# Patient Record
Sex: Female | Born: 1970 | Hispanic: No | Marital: Married | Smoking: Never smoker
Health system: Southern US, Community
[De-identification: ages and names within clinical notes are randomized; demographics above are authoritative.]

## PROBLEM LIST (undated history)

## (undated) DIAGNOSIS — E119 Type 2 diabetes mellitus without complications: Secondary | ICD-10-CM

## (undated) HISTORY — DX: Type 2 diabetes mellitus without complications: E11.9

---

## 2009-08-22 ENCOUNTER — Emergency Department (HOSPITAL_COMMUNITY): Admission: EM | Admit: 2009-08-22 | Discharge: 2009-08-23 | Payer: Self-pay | Admitting: Emergency Medicine

## 2009-09-06 ENCOUNTER — Other Ambulatory Visit: Admission: RE | Admit: 2009-09-06 | Discharge: 2009-09-06 | Payer: Self-pay | Admitting: Family Medicine

## 2009-10-04 ENCOUNTER — Encounter: Admission: RE | Admit: 2009-10-04 | Discharge: 2009-10-04 | Payer: Self-pay | Admitting: Family Medicine

## 2010-06-05 LAB — URINALYSIS, ROUTINE W REFLEX MICROSCOPIC
Bilirubin Urine: NEGATIVE
Hgb urine dipstick: NEGATIVE
Ketones, ur: NEGATIVE mg/dL
Protein, ur: NEGATIVE mg/dL

## 2010-06-05 LAB — POCT I-STAT, CHEM 8
BUN: 4 mg/dL — ABNORMAL LOW (ref 6–23)
Calcium, Ion: 1.17 mmol/L (ref 1.12–1.32)
Creatinine, Ser: 0.4 mg/dL (ref 0.4–1.2)
Sodium: 136 mEq/L (ref 135–145)

## 2010-06-05 LAB — URINE MICROSCOPIC-ADD ON

## 2011-05-02 ENCOUNTER — Other Ambulatory Visit: Payer: Self-pay | Admitting: Family Medicine

## 2011-05-02 DIAGNOSIS — Z1231 Encounter for screening mammogram for malignant neoplasm of breast: Secondary | ICD-10-CM

## 2011-05-07 ENCOUNTER — Ambulatory Visit
Admission: RE | Admit: 2011-05-07 | Discharge: 2011-05-07 | Disposition: A | Discharge status: Home / Self Care | Payer: BC Managed Care – PPO | Source: Ambulatory Visit | Attending: Family Medicine | Admitting: Family Medicine

## 2011-05-07 DIAGNOSIS — Z1231 Encounter for screening mammogram for malignant neoplasm of breast: Secondary | ICD-10-CM

## 2012-05-05 ENCOUNTER — Other Ambulatory Visit: Payer: Self-pay | Admitting: Family Medicine

## 2012-05-05 DIAGNOSIS — Z1231 Encounter for screening mammogram for malignant neoplasm of breast: Secondary | ICD-10-CM

## 2012-08-01 ENCOUNTER — Ambulatory Visit
Admission: RE | Admit: 2012-08-01 | Discharge: 2012-08-01 | Disposition: A | Discharge status: Home / Self Care | Payer: BC Managed Care – PPO | Source: Ambulatory Visit | Attending: Family Medicine | Admitting: Family Medicine

## 2012-08-01 DIAGNOSIS — Z1231 Encounter for screening mammogram for malignant neoplasm of breast: Secondary | ICD-10-CM

## 2012-08-07 ENCOUNTER — Other Ambulatory Visit (HOSPITAL_COMMUNITY)
Admission: RE | Admit: 2012-08-07 | Discharge: 2012-08-07 | Disposition: A | Discharge status: Home / Self Care | Payer: BC Managed Care – PPO | Source: Ambulatory Visit | Attending: Family Medicine | Admitting: Family Medicine

## 2012-08-07 ENCOUNTER — Other Ambulatory Visit: Payer: Self-pay | Admitting: Family Medicine

## 2012-08-07 DIAGNOSIS — Z124 Encounter for screening for malignant neoplasm of cervix: Secondary | ICD-10-CM | POA: Insufficient documentation

## 2012-12-23 ENCOUNTER — Ambulatory Visit (INDEPENDENT_AMBULATORY_CARE_PROVIDER_SITE_OTHER): Payer: BC Managed Care – PPO | Admitting: *Deleted

## 2012-12-23 DIAGNOSIS — M722 Plantar fascial fibromatosis: Secondary | ICD-10-CM

## 2012-12-23 NOTE — Patient Instructions (Signed)

## 2012-12-23 NOTE — Progress Notes (Signed)
Dispensed orthotics with oral and written instructions for wearing. Patient will follow up with Dr. Al Corpus in 1 month for an orthotic check.

## 2013-06-25 ENCOUNTER — Other Ambulatory Visit: Payer: Self-pay | Admitting: Gastroenterology

## 2013-06-25 DIAGNOSIS — R109 Unspecified abdominal pain: Secondary | ICD-10-CM

## 2013-07-01 ENCOUNTER — Other Ambulatory Visit: Payer: BC Managed Care – PPO

## 2013-07-31 ENCOUNTER — Other Ambulatory Visit: Payer: Self-pay | Admitting: Family Medicine

## 2013-07-31 DIAGNOSIS — N6452 Nipple discharge: Secondary | ICD-10-CM

## 2013-07-31 DIAGNOSIS — R234 Changes in skin texture: Secondary | ICD-10-CM

## 2013-08-11 ENCOUNTER — Ambulatory Visit
Admission: RE | Admit: 2013-08-11 | Discharge: 2013-08-11 | Disposition: A | Discharge status: Home / Self Care | Payer: BC Managed Care – PPO | Source: Ambulatory Visit | Attending: Family Medicine | Admitting: Family Medicine

## 2013-08-11 DIAGNOSIS — R234 Changes in skin texture: Secondary | ICD-10-CM

## 2013-08-11 DIAGNOSIS — N6452 Nipple discharge: Secondary | ICD-10-CM

## 2014-05-05 ENCOUNTER — Other Ambulatory Visit: Payer: Self-pay | Admitting: Dermatology

## 2014-09-16 ENCOUNTER — Encounter: Payer: Self-pay | Admitting: Podiatry

## 2014-09-16 ENCOUNTER — Ambulatory Visit (INDEPENDENT_AMBULATORY_CARE_PROVIDER_SITE_OTHER): Payer: BLUE CROSS/BLUE SHIELD | Admitting: Podiatry

## 2014-09-16 VITALS — BP 122/86 | HR 69 | Resp 12

## 2014-09-16 DIAGNOSIS — M722 Plantar fascial fibromatosis: Secondary | ICD-10-CM | POA: Diagnosis not present

## 2014-09-16 MED ORDER — MELOXICAM 15 MG PO TABS: 15.0000 mg | ORAL_TABLET | Freq: Every day | ORAL | Status: DC

## 2014-09-19 NOTE — Progress Notes (Signed)
She presents today for a chief complaint of pain to the bottom of her left heel. She states that she is about to take a trip to [place] and will be there for an extended period of time and she does not want her foot to her. She states the right one is beginning to hurt but not nearly as bad. She denies any changes in her past medical history medications allergies surgery social history. She does relate that she probably needs to purchase new shoe gear. And would like me to evaluate her orthotics.  Objective: Vital signs are stable she is alert and oriented 3 pulses are palpable bilateral. Neurologic sensorium is intact. She has pain on palpation medial calcaneal tubercle of the left heel. Minimally so to the right heel. Otherwise orthopedics demonstrates rectus foot type bilateral.  Assessment: Plantar fasciitis left greater than right.  Plan: Discussed etiology pathology conservative versus surgical therapies injected the left heel today with Kenalog local anesthesia. Dispensed a prescription for Medrol Dosepak to be followed by meloxicam. Also dispensed to plantar fascial braces and will follow up with her once she returns from [place].

## 2015-05-30 ENCOUNTER — Other Ambulatory Visit: Payer: Self-pay

## 2015-05-30 DIAGNOSIS — Z1231 Encounter for screening mammogram for malignant neoplasm of breast: Secondary | ICD-10-CM

## 2015-06-16 ENCOUNTER — Ambulatory Visit: Payer: Self-pay

## 2015-06-23 DIAGNOSIS — M1711 Unilateral primary osteoarthritis, right knee: Secondary | ICD-10-CM | POA: Diagnosis not present

## 2015-06-29 ENCOUNTER — Ambulatory Visit: Payer: BLUE CROSS/BLUE SHIELD | Attending: Orthopedic Surgery

## 2015-06-29 DIAGNOSIS — M6281 Muscle weakness (generalized): Secondary | ICD-10-CM | POA: Insufficient documentation

## 2015-06-29 DIAGNOSIS — M25561 Pain in right knee: Secondary | ICD-10-CM | POA: Insufficient documentation

## 2015-06-29 NOTE — Therapy (Signed)
[facility] [address], [address] [place], [place], [address] Phone: [phone]   Fax:  [phone]  Physical Therapy Evaluation  Patient Details  Name: Alison Sparks MRN: [id number] Date of Birth: [birth year] Referring Provider: Royal Piedra, MD  Encounter Date: 06/29/2015      PT End of Session - 06/29/15 1644    Visit Number 1   Date for PT Re-Evaluation 08/24/15   PT Start Time 1612   PT Stop Time 1645   PT Time Calculation (min) 33 min   Activity Tolerance Patient tolerated treatment well   Behavior During Therapy [facility] for tasks assessed/performed      Past Medical History  Diagnosis Date  . Diabetes mellitus without complication (HCC)     History reviewed. No pertinent past surgical history.  There were no vitals filed for this visit.       Subjective Assessment - 06/29/15 1617    Subjective Pt presents to PT with complaints of Rt knee pain that began ~6 weeks ago.  No known injury. Pt got a new dog and has been walking more with the dog.  Pt had cortizone injection in early March and this helped for a few days.     Pertinent History Cortizone injection 05/2015.     Limitations Walking   How long can you walk comfortably? able to walk with dog 3+ miles-Rt knee pain with decending steps and hills   Diagnostic tests x-ray: DJD in the Rt knee.    Patient Stated Goals reduce Rt knee pain   Currently in Pain? Yes   Pain Score 6    Pain Location Knee   Pain Orientation Right   Pain Descriptors / Indicators Sore   Pain Type Acute pain   Pain Onset More than a month ago   Pain Frequency Intermittent   Aggravating Factors  descending steps and hills   Pain Relieving Factors not descending hills or steps, Alison Sparks            [facility] PT Assessment - 06/29/15 0001    Assessment   Medical Diagnosis OA Rt knee   Referring Provider Royal Piedra, MD   Onset Date/Surgical Date 05/17/15   Next MD Visit 07/17/15    Precautions   Precautions None   Balance Screen   Has the patient fallen in the past 6 months No   Has the patient had a decrease in activity level because of a fear of falling?  No   Is the patient reluctant to leave their home because of a fear of falling?  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    Leisure walking dog   Cognition   Overall Cognitive Status Within Functional Limits for tasks assessed   Observation/Other Assessments   Focus on Therapeutic Outcomes (FOTO)  49% limitation   Posture/Postural Control   Posture/Postural Control No significant limitations   ROM / Strength   AROM / PROM / Strength AROM;PROM;Strength   AROM   Overall AROM  Within functional limits for tasks performed   Overall AROM Comments Rt=Lt knee and hip AROM   PROM   Overall PROM  Within functional limits for tasks performed   Strength   Overall Strength Deficits   Overall Strength Comments Bil hip strength 4/5, knees 4+/5 bil.     Palpation  Patella mobility crepitus in bil.patella with mobs in all directions.  Pain with compression on the Rt   Ambulation/Gait   Ambulation/Gait Yes   Ambulation/Gait Assistance 7: Independent   Ambulation Distance (Feet) 100 Feet   Gait Pattern Within Functional Limits   Gait Comments normal stair ambulation without pain                           PT Education - 06/29/15 1640    Education provided Yes   Education Details HEP: hip and knee strength, hamstring stretches   Person(s) Educated Patient   Methods Explanation;Demonstration;Handout   Comprehension Verbalized understanding;Returned demonstration          PT Short Term Goals - 06/29/15 1649    PT SHORT TERM GOAL #1   Title be independent in initial HEP   Time 4   Period Weeks   Status New   PT SHORT TERM GOAL #2   Title report < or = to 4/10 Rt knee pain  with activity   Time 4   Period Weeks   Status New           PT Long Term Goals - 06/29/15 1613    PT LONG TERM GOAL #1   Title be independent in advanced HEP   Time 8   Period Weeks   Status New   PT LONG TERM GOAL #2   Title reduce FOTO to < or = to 36% limitation   Time 8   Period Weeks   Status New   PT LONG TERM GOAL #3   Title report < or = to 2/10 Rt knee pain with activity   Time 8   Period Weeks   Status New   PT LONG TERM GOAL #4   Title descend steps or hills with 70% less Rt knee pain   Time 8   Period Weeks   Status New   PT LONG TERM GOAL #5   Title demonstrate 4+/5 bilateral hip strength to improve ability to descend steps and hills   Time 8   Period Weeks   Status New               Plan - 06/29/15 1644    Clinical Impression Statement Pt presents to PT with complaints of Rt knee pain that began ~6 weeks ago.  Pt reports that she has been walking her new dog and this might be a contributing factor.  Recent x-ray showed DJD in the Rt knee.  Pt demonstrates weakness in bil. hips and kness and crepitus in bil. knees with open and closed chain exercises.  Pt with palpable tenderness over distal patella.  Pt will benefit from skilled PT for hip and knee strength, modalties and manual as needed.     Rehab Potential Good   PT Frequency 2x / week   PT Duration 8 weeks   PT Treatment/Interventions ADLs/Self Care Home Management;Cryotherapy;Electrical Stimulation;Moist Heat;Therapeutic exercise;Therapeutic activities;Functional mobility training;Stair training;Gait training;Ultrasound;Neuromuscular re-education;Patient/family education;Manual techniques;Taping;Dry needling;Passive range of motion   PT Next Visit Plan Bil hip and knee strength, manual to distal Rt patella, modalities as needed.   Consulted and Agree with Plan of Care Patient      Patient will benefit from skilled therapeutic intervention in order to improve the following deficits and  impairments:  Pain, Decreased strength, Decreased activity tolerance  Visit Diagnosis: Pain in right knee - Plan: PT plan of care cert/re-cert  Muscle weakness (generalized) -  Plan: PT plan of care cert/re-cert     Problem List There are no active problems to display for this patient.   Lorrene Reid, PT 06/29/2015 4:52 PM  [facility] [address], [address] [place], [place], [address] Phone: [phone]   Fax:  [phone]  Name: Alison Sparks MRN: [id number] Date of Birth: [birth year]

## 2015-06-29 NOTE — Patient Instructions (Signed)
Hip Flexion / Knee Extension: Straight-Leg Raise (Eccentric)   Lie on back. Lift leg with knee straight. Slowly lower leg for 3-5 seconds. Do 2 sets of 10, 2x/day  ABDUCTION: Side-Lying (Active)   Lie on left side, top leg straight. Raise top leg as far as possible. Use ___ lbs. Complete _2__ sets of _10__ repetitions. Perform _2__ sessions per day.  http://gtsc.exer.us/94   KNEE: Extension, Long Arc Quads - Sitting    Raise leg until knee is straight.  Hold 5 seconds.  __10_ reps per set, _3__ sets per day  Copyright  VHI. All rights reserved.  HIP: Hamstrings - Short Sitting     Keep knee straight. Lift chest. Hold 20___ seconds. __3_ reps per set, __3_ sets per day  Copyright  VHI. All rights reserved.  [facility] [address], [address] [place], [place] [address] Phone # [phone] Fax [phone]

## 2015-06-30 ENCOUNTER — Ambulatory Visit: Payer: Self-pay | Admitting: Physical Therapy

## 2015-07-05 ENCOUNTER — Ambulatory Visit
Admission: RE | Admit: 2015-07-05 | Discharge: 2015-07-05 | Disposition: A | Discharge status: Home / Self Care | Payer: BLUE CROSS/BLUE SHIELD | Source: Ambulatory Visit

## 2015-07-05 DIAGNOSIS — Z1231 Encounter for screening mammogram for malignant neoplasm of breast: Secondary | ICD-10-CM

## 2015-07-06 ENCOUNTER — Ambulatory Visit: Payer: BLUE CROSS/BLUE SHIELD | Admitting: Physical Therapy

## 2015-07-06 ENCOUNTER — Encounter: Payer: Self-pay | Admitting: Physical Therapy

## 2015-07-06 DIAGNOSIS — M6281 Muscle weakness (generalized): Secondary | ICD-10-CM

## 2015-07-06 DIAGNOSIS — M25561 Pain in right knee: Secondary | ICD-10-CM

## 2015-07-06 NOTE — Patient Instructions (Signed)
Apply ice to her knee 2x day for 10 min. Pt verbally agreed to do.

## 2015-07-06 NOTE — Therapy (Signed)
[facility] [address] [place], [place], [address] Phone: [phone]   Fax:  [phone]  Physical Therapy Treatment  Patient Details  Name: Lashon Beringer MRN: [id number] Date of Birth: 09-26-1970 Referring Provider: Royal Piedra, MD  Encounter Date: 07/06/2015      PT End of Session - 07/06/15 1011    Visit Number 2   Date for PT Re-Evaluation 08/24/15   PT Start Time 0930   PT Stop Time 1013   PT Time Calculation (min) 43 min   Activity Tolerance Patient tolerated treatment well   Behavior During Therapy [facility] for tasks assessed/performed      Past Medical History  Diagnosis Date  . Diabetes mellitus without complication (HCC)     History reviewed. No pertinent past surgical history.  There were no vitals filed for this visit.      Subjective Assessment - 07/06/15 0938    Subjective I couldn't really exercises and my knee continues to hurt.    Pain Score 3    Pain Location Knee   Pain Orientation Right   Pain Descriptors / Indicators Dull   Aggravating Factors  The morning is rough   Pain Relieving Factors meds   Multiple Pain Sites No                         [facility] Adult PT Treatment/Exercise - 07/06/15 0001    Knee/Hip Exercises: Aerobic   Nustep L2 x 8 min   Knee/Hip Exercises: Standing   Hip Abduction Stengthening;Both;1 set;10 reps   Other Standing Knee Exercises Tree pose RT 2 x10 sec   2 fingers needed for balance   Knee/Hip Exercises: Supine   Quad Sets Strengthening;Right;20 reps   Straight Leg Raises Strengthening;Right;1 set;10 reps   Other Supine Knee/Hip Exercises Glute & ball squeeze 10x 5 sec    Ultrasound   Ultrasound Location Rt knee anteriorlateral   Ultrasound Parameters 1wtcm2 100%   Ultrasound Goals Pain                  PT Short Term Goals - 07/06/15 0949    PT SHORT TERM GOAL #1   Title be independent in initial HEP   Time 4    Period Weeks   Status On-going           PT Long Term Goals - 06/29/15 1613    PT LONG TERM GOAL #1   Title be independent in advanced HEP   Time 8   Period Weeks   Status New   PT LONG TERM GOAL #2   Title reduce FOTO to < or = to 36% limitation   Time 8   Period Weeks   Status New   PT LONG TERM GOAL #3   Title report < or = to 2/10 Rt knee pain with activity   Time 8   Period Weeks   Status New   PT LONG TERM GOAL #4   Title descend steps or hills with 70% less Rt knee pain   Time 8   Period Weeks   Status New   PT LONG TERM GOAL #5   Title demonstrate 4+/5 bilateral hip strength to improve ability to descend steps and hills   Time 8   Period Weeks   Status New               Plan - 07/06/15 1005    Clinical Impression Statement  pt reports her kids were on Spring Break last so she was unable to do her HEP. She thinks she can get into a routine now.  Used Ultrasound today in her tender area to see if we can make that less tender. Just her first visit after eval so some early for goal  attainment.    Rehab Potential Good   PT Frequency 2x / week   PT Duration 8 weeks   PT Treatment/Interventions ADLs/Self Care Home Management;Cryotherapy;Electrical Stimulation;Moist Heat;Therapeutic exercise;Therapeutic activities;Functional mobility training;Stair training;Gait training;Ultrasound;Neuromuscular re-education;Patient/family education;Manual techniques;Taping;Dry needling;Passive range of motion   PT Next Visit Plan Continue to strengthen knee and hips. See if US was helpful.    Consulted and Agree with Plan of Care Patient      Patient will benefit from skilled therapeutic intervention in order to improve the following deficits and impairments:  Pain, Decreased strength, Decreased activity tolerance  Visit Diagnosis: Pain in right knee  Muscle weakness (generalized)     Problem List There are no active problems to display for this  patient.   Ariatna Jester, PTA 07/06/2015, 10:14 AM  [facility] [address], [address] [place], [place], [address] Phone: [phone]   Fax:  [phone]  Name: Oren Sectionnjali Aldaz MRN: [id number] Date of Birth: [birth year]

## 2015-07-08 ENCOUNTER — Ambulatory Visit: Payer: BLUE CROSS/BLUE SHIELD | Admitting: Physical Therapy

## 2015-07-08 DIAGNOSIS — M6281 Muscle weakness (generalized): Secondary | ICD-10-CM | POA: Diagnosis not present

## 2015-07-08 DIAGNOSIS — M25561 Pain in right knee: Secondary | ICD-10-CM

## 2015-07-08 NOTE — Therapy (Signed)
[facility] [address], [address] [place], [place], [address] Phone: [phone]   Fax:  [phone]  Physical Therapy Treatment  Patient Details  Name: Alison Sparks MRN: [id number] Date of Birth: Apr 27, 1970 Referring Provider: Royal Piedra, MD  Encounter Date: 07/08/2015      PT End of Session - 07/08/15 0931    Visit Number 3   Date for PT Re-Evaluation 08/24/15   PT Start Time 0923   PT Stop Time 1008   PT Time Calculation (min) 45 min   Activity Tolerance Patient tolerated treatment well   Behavior During Therapy [facility] for tasks assessed/performed      Past Medical History  Diagnosis Date  . Diabetes mellitus without complication (HCC)     No past surgical history on file.  There were no vitals filed for this visit.      Subjective Assessment - 07/08/15 0931    Subjective Since yesterday, no pain in rt knee.    Pertinent History Cortizone injection 05/2015.     Limitations Walking   How long can you walk comfortably? able to walk with dog 3+ miles-Rt knee pain with decending steps and hills   Diagnostic tests x-ray: DJD in the Rt knee.    Patient Stated Goals reduce Rt knee pain   Currently in Pain? No/denies                         [facility] Adult PT Treatment/Exercise - 07/08/15 0001    Knee/Hip Exercises: Aerobic   Nustep L2 x 8 min   Knee/Hip Exercises: Standing   Lateral Step Up Both;1 set;10 reps;Hand Hold: 1;Step Height: 6"  1.5# added, pt feels the gluts    Other Standing Knee Exercises Tree pose RT 2 x20 sec   2 fingers needed for balance at times   Knee/Hip Exercises: Supine   Quad Sets Strengthening;Right;20 reps   Straight Leg Raises Strengthening;Right;1 set;10 reps   Other Supine Knee/Hip Exercises Glute & ball squeeze 10x 5 sec    Ultrasound   Ultrasound Location Rt knee   Ultrasound Parameters 100%, , 1W/cm2    Ultrasound Goals Pain                  PT  Short Term Goals - 07/08/15 0933    PT SHORT TERM GOAL #1   Title be independent in initial HEP   Time 4   Period Weeks   Status On-going   PT SHORT TERM GOAL #2   Title report < or = to 4/10 Rt knee pain with activity   Time 4   Period Weeks   Status On-going           PT Long Term Goals - 06/29/15 1613    PT LONG TERM GOAL #1   Title be independent in advanced HEP   Time 8   Period Weeks   Status New   PT LONG TERM GOAL #2   Title reduce FOTO to < or = to 36% limitation   Time 8   Period Weeks   Status New   PT LONG TERM GOAL #3   Title report < or = to 2/10 Rt knee pain with activity   Time 8   Period Weeks   Status New   PT LONG TERM GOAL #4   Title descend steps or hills with 70% less Rt knee pain   Time 8   Period Weeks  Status New   PT LONG TERM GOAL #5   Title demonstrate 4+/5 bilateral hip strength to improve ability to descend steps and hills   Time 8   Period Weeks   Status New               Plan - 07/08/15 0932    Clinical Impression Statement Pt with good tolerance of exercises, due to no pain today. .    Rehab Potential Good   PT Frequency 2x / week   PT Duration 8 weeks   PT Treatment/Interventions ADLs/Self Care Home Management;Cryotherapy;Electrical Stimulation;Moist Heat;Therapeutic exercise;Therapeutic activities;Functional mobility training;Stair training;Gait training;Ultrasound;Neuromuscular re-education;Patient/family education;Manual techniques;Taping;Dry needling;Passive range of motion   PT Next Visit Plan Continue to strengthen knee and hips. See if US was helpful.    Consulted and Agree with Plan of Care Patient      Patient will benefit from skilled therapeutic intervention in order to improve the following deficits and impairments:  Pain, Decreased strength, Decreased activity tolerance  Visit Diagnosis: Pain in right knee  Muscle weakness (generalized)   Problem List There are no active problems to display for  this patient.  Laney Louderback Naumann-Houegnifio, PTA 07/08/2015 12:00 PM  [facility] [address], [address] [place], [place], [address] Phone: [phone]   Fax:  [phone]  Name: Alison Sparks MRN: [id number] Date of Birth: 1971-01-06

## 2015-07-13 ENCOUNTER — Encounter: Payer: BLUE CROSS/BLUE SHIELD | Admitting: Physical Therapy

## 2015-07-15 ENCOUNTER — Ambulatory Visit: Payer: BLUE CROSS/BLUE SHIELD | Admitting: Physical Therapy

## 2015-07-15 DIAGNOSIS — M25561 Pain in right knee: Secondary | ICD-10-CM

## 2015-07-15 DIAGNOSIS — M6281 Muscle weakness (generalized): Secondary | ICD-10-CM | POA: Diagnosis not present

## 2015-07-15 NOTE — Therapy (Addendum)
[facility] [address], [address] [place], [place], [address] Phone: [phone]   Fax:  [phone]  Physical Therapy Treatment  Patient Details  Name: Alison Sparks MRN: [id number] Date of Birth: 1970/10/11 Referring Provider: Royal Piedra, MD  Encounter Date: 07/15/2015      PT End of Session - 07/15/15 1012    Visit Number 4   Date for PT Re-Evaluation 08/24/15   PT Start Time 0928   PT Stop Time 1013   PT Time Calculation (min) 45 min   Activity Tolerance Patient tolerated treatment well   Behavior During Therapy [facility] for tasks assessed/performed      Past Medical History  Diagnosis Date  . Diabetes mellitus without complication (HCC)     No past surgical history on file.  There were no vitals filed for this visit.      Subjective Assessment - 07/15/15 0930    Subjective Knee is doing great. She reports her plantar fascitits is bothering her more. This has been treated in th epast by podiatrist.   Currently in Pain? Yes   Pain Score 7    Pain Location Foot   Pain Orientation Left   Pain Descriptors / Indicators Shooting;Stabbing   Aggravating Factors  Foot has been pretty constant lately.   Pain Relieving Factors Not much, knee is 80% better via exercises and [place]   Multiple Pain Sites No            [facility] PT Assessment - 07/15/15 0001    Observation/Other Assessments   Focus on Therapeutic Outcomes (FOTO)  28% limitation   Strength   Overall Strength Comments Bil hip 4+/5 - 5/5                      [facility] Adult PT Treatment/Exercise - 07/15/15 0001    Knee/Hip Exercises: Aerobic   Nustep L2 x 10 min   Knee/Hip Exercises: Standing   Hip Abduction Stengthening;Both;2 sets;10 reps   Knee/Hip Exercises: Supine   Straight Leg Raises Strengthening;Right;3 sets;10 reps   Other Supine Knee/Hip Exercises Glute & ball squeeze 10x 5 sec    Ultrasound   Ultrasound Location Rt lateral knee   Ultrasound Parameters 100% 3 MZ 1.3 wtcm2   Ultrasound Goals Pain                  PT Short Term Goals - 07/15/15 0934    PT SHORT TERM GOAL #1   Title be independent in initial HEP   Time 4   Period Weeks   Status Achieved   PT SHORT TERM GOAL #2   Title report < or = to 4/10 Rt knee pain with activity   Time 4   Period Weeks   Status Achieved  4/10           PT Long Term Goals - 07/15/15 0935    PT LONG TERM GOAL #1   Title be independent in advanced HEP   Time 8   Period Weeks   Status Achieved   PT LONG TERM GOAL #2   Title reduce FOTO to < or = to 36% limitation   Time 8   Period Weeks   Status Achieved  28% limited   PT LONG TERM GOAL #3   Title report < or = to 2/10 Rt knee pain with activity   Time 8   Period Weeks   PT LONG TERM GOAL #4   Title descend steps  or hills with 70% less Rt knee pain   Time 8   Period Weeks   Status Achieved  80%   PT LONG TERM GOAL #5   Title demonstrate 4+/5 bilateral hip strength to improve ability to descend steps and hills   Time 8   Period Weeks   Status Achieved               Plan - 07/15/15 1012    Clinical Impression Statement Pt reports her knee is 80% better. She has a history of plantar fascititis which is more painful and limiting at this time. She would like to DC her knee and persue order for her foot.     Rehab Potential Good   PT Frequency 2x / week   PT Duration 8 weeks   PT Treatment/Interventions ADLs/Self Care Home Management;Cryotherapy;Electrical Stimulation;Moist Heat;Therapeutic exercise;Therapeutic activities;Functional mobility training;Stair training;Gait training;Ultrasound;Neuromuscular re-education;Patient/family education;Manual techniques;Taping;Dry needling;Passive range of motion   PT Next Visit Plan DC knee. Pt wants to persue new order for her foot.    Consulted and Agree with Plan of Care Patient      Patient will benefit from skilled therapeutic intervention in  order to improve the following deficits and impairments:  Pain, Decreased strength, Decreased activity tolerance  Visit Diagnosis: Pain in right knee  Muscle weakness (generalized)     Problem List There are no active problems to display for this patient.   Myrene Galas, PTA 07/15/2015 10:17 AM PHYSICAL THERAPY DISCHARGE SUMMARY  Visits from Start of Care: 4  Current functional level related to goals / functional outcomes: See above for current status.  Pt will D/C to HEP and pursue PT order for plantar faciitis.     Remaining deficits: See above for current status.     Education / Equipment: HEP Plan: Patient agrees to discharge.  Patient goals were met. Patient is being discharged due to meeting the stated rehab goals.  ?????   Sigurd Sos, PT 07/18/2015 8:06 AM  [facility] [address], [place], [place], [address] Phone: [phone]   Fax:  [phone]  Name: Alison Sparks MRN: [id number] Date of Birth: October 08, 1970

## 2015-12-05 DIAGNOSIS — E7800 Pure hypercholesterolemia, unspecified: Secondary | ICD-10-CM | POA: Diagnosis not present

## 2015-12-05 DIAGNOSIS — E1165 Type 2 diabetes mellitus with hyperglycemia: Secondary | ICD-10-CM | POA: Diagnosis not present

## 2015-12-07 DIAGNOSIS — E7800 Pure hypercholesterolemia, unspecified: Secondary | ICD-10-CM | POA: Diagnosis not present

## 2015-12-07 DIAGNOSIS — E1165 Type 2 diabetes mellitus with hyperglycemia: Secondary | ICD-10-CM | POA: Diagnosis not present

## 2015-12-07 DIAGNOSIS — E559 Vitamin D deficiency, unspecified: Secondary | ICD-10-CM | POA: Diagnosis not present

## 2015-12-07 DIAGNOSIS — Z23 Encounter for immunization: Secondary | ICD-10-CM | POA: Diagnosis not present

## 2015-12-07 DIAGNOSIS — I10 Essential (primary) hypertension: Secondary | ICD-10-CM | POA: Diagnosis not present

## 2016-03-30 DIAGNOSIS — J069 Acute upper respiratory infection, unspecified: Secondary | ICD-10-CM | POA: Diagnosis not present

## 2016-04-25 DIAGNOSIS — J101 Influenza due to other identified influenza virus with other respiratory manifestations: Secondary | ICD-10-CM | POA: Diagnosis not present

## 2016-04-25 DIAGNOSIS — R509 Fever, unspecified: Secondary | ICD-10-CM | POA: Diagnosis not present

## 2016-06-04 DIAGNOSIS — E559 Vitamin D deficiency, unspecified: Secondary | ICD-10-CM | POA: Diagnosis not present

## 2016-06-04 DIAGNOSIS — E7800 Pure hypercholesterolemia, unspecified: Secondary | ICD-10-CM | POA: Diagnosis not present

## 2016-06-04 DIAGNOSIS — E1165 Type 2 diabetes mellitus with hyperglycemia: Secondary | ICD-10-CM | POA: Diagnosis not present

## 2016-06-04 DIAGNOSIS — I10 Essential (primary) hypertension: Secondary | ICD-10-CM | POA: Diagnosis not present

## 2016-08-15 ENCOUNTER — Other Ambulatory Visit: Payer: Self-pay | Admitting: Family Medicine

## 2016-08-15 DIAGNOSIS — Z1231 Encounter for screening mammogram for malignant neoplasm of breast: Secondary | ICD-10-CM

## 2016-09-04 ENCOUNTER — Ambulatory Visit
Admission: RE | Admit: 2016-09-04 | Discharge: 2016-09-04 | Disposition: A | Discharge status: Home / Self Care | Payer: BLUE CROSS/BLUE SHIELD | Source: Ambulatory Visit | Attending: Family Medicine | Admitting: Family Medicine

## 2016-09-04 DIAGNOSIS — Z1231 Encounter for screening mammogram for malignant neoplasm of breast: Secondary | ICD-10-CM

## 2016-10-02 ENCOUNTER — Encounter (INDEPENDENT_AMBULATORY_CARE_PROVIDER_SITE_OTHER): Payer: BLUE CROSS/BLUE SHIELD | Admitting: Podiatry

## 2016-10-02 NOTE — Progress Notes (Signed)
This encounter was created in error - please disregard.

## 2016-10-16 ENCOUNTER — Ambulatory Visit (INDEPENDENT_AMBULATORY_CARE_PROVIDER_SITE_OTHER): Payer: BLUE CROSS/BLUE SHIELD | Admitting: Podiatry

## 2016-10-16 ENCOUNTER — Encounter: Payer: Self-pay | Admitting: Podiatry

## 2016-10-16 DIAGNOSIS — M722 Plantar fascial fibromatosis: Secondary | ICD-10-CM | POA: Diagnosis not present

## 2016-10-17 NOTE — Progress Notes (Signed)
She presents today for follow-up of plantar fasciitis states that is starting to bother her just a little bit but would like to consider a new pair of orthotics. She feels that is her shoes and her orthotics that are resulting in her symptoms.  Objective: Vital signs are stable she is alert and oriented 3. Pulses are palpable. No changes in previous evaluation. She does still have some tenderness on palpation medial continue tube of the left heel but not nearly as painful as previously noted.  Assessment: Pain and limb associated with plantar fasciitis left foot.  Plan: She was molded for orthotics today. Follow-up with me in the near future.

## 2016-11-06 ENCOUNTER — Ambulatory Visit (INDEPENDENT_AMBULATORY_CARE_PROVIDER_SITE_OTHER): Payer: BLUE CROSS/BLUE SHIELD | Admitting: Podiatry

## 2016-11-06 ENCOUNTER — Encounter: Payer: Self-pay | Admitting: Podiatry

## 2016-11-06 DIAGNOSIS — M722 Plantar fascial fibromatosis: Secondary | ICD-10-CM

## 2016-11-06 NOTE — Progress Notes (Signed)
She presents today to pick up her orthotics. She states that her plantar fasciitis is doing better than it was. She states that she would like to hold off on performing matrixectomy's to her toenails until after her next A1c.  Objective: Vital signs are stable she is alert and oriented 3. Pulses are palpable. Orthotics. No pain on palpation medial calcaneal tubercles. No signs of infection.  Assessment: Plantar fasciitis resolving.  Plan: Follow up with me in September for matrixectomy's. Follow-up with me with concerns of her orthotics.

## 2016-11-22 ENCOUNTER — Other Ambulatory Visit: Payer: BLUE CROSS/BLUE SHIELD | Admitting: Orthotics

## 2016-11-27 ENCOUNTER — Other Ambulatory Visit: Payer: BLUE CROSS/BLUE SHIELD | Admitting: Orthotics

## 2016-12-04 DIAGNOSIS — J069 Acute upper respiratory infection, unspecified: Secondary | ICD-10-CM | POA: Diagnosis not present

## 2016-12-18 DIAGNOSIS — Z23 Encounter for immunization: Secondary | ICD-10-CM | POA: Diagnosis not present

## 2017-02-12 ENCOUNTER — Other Ambulatory Visit: Payer: Self-pay | Admitting: Family Medicine

## 2017-02-12 ENCOUNTER — Other Ambulatory Visit (HOSPITAL_COMMUNITY)
Admission: RE | Admit: 2017-02-12 | Discharge: 2017-02-12 | Disposition: A | Discharge status: Home / Self Care | Payer: BLUE CROSS/BLUE SHIELD | Source: Ambulatory Visit | Attending: Family Medicine | Admitting: Family Medicine

## 2017-02-12 DIAGNOSIS — Z0000 Encounter for general adult medical examination without abnormal findings: Secondary | ICD-10-CM | POA: Diagnosis not present

## 2017-02-12 DIAGNOSIS — N926 Irregular menstruation, unspecified: Secondary | ICD-10-CM | POA: Diagnosis not present

## 2017-02-12 DIAGNOSIS — Z124 Encounter for screening for malignant neoplasm of cervix: Secondary | ICD-10-CM | POA: Diagnosis not present

## 2017-02-12 DIAGNOSIS — M62838 Other muscle spasm: Secondary | ICD-10-CM | POA: Diagnosis not present

## 2017-02-12 DIAGNOSIS — I10 Essential (primary) hypertension: Secondary | ICD-10-CM | POA: Diagnosis not present

## 2017-02-12 DIAGNOSIS — E1165 Type 2 diabetes mellitus with hyperglycemia: Secondary | ICD-10-CM | POA: Diagnosis not present

## 2017-02-14 LAB — CYTOLOGY - PAP: DIAGNOSIS: NEGATIVE

## 2017-02-15 DIAGNOSIS — R7989 Other specified abnormal findings of blood chemistry: Secondary | ICD-10-CM | POA: Diagnosis not present

## 2017-02-15 DIAGNOSIS — E781 Pure hyperglyceridemia: Secondary | ICD-10-CM | POA: Diagnosis not present

## 2017-02-21 DIAGNOSIS — M25561 Pain in right knee: Secondary | ICD-10-CM | POA: Diagnosis not present

## 2017-04-10 DIAGNOSIS — E119 Type 2 diabetes mellitus without complications: Secondary | ICD-10-CM | POA: Diagnosis not present

## 2017-04-19 DIAGNOSIS — E559 Vitamin D deficiency, unspecified: Secondary | ICD-10-CM | POA: Diagnosis not present

## 2017-04-19 DIAGNOSIS — E1165 Type 2 diabetes mellitus with hyperglycemia: Secondary | ICD-10-CM | POA: Diagnosis not present

## 2017-04-19 DIAGNOSIS — E7800 Pure hypercholesterolemia, unspecified: Secondary | ICD-10-CM | POA: Diagnosis not present

## 2017-04-19 DIAGNOSIS — I10 Essential (primary) hypertension: Secondary | ICD-10-CM | POA: Diagnosis not present

## 2017-08-27 ENCOUNTER — Other Ambulatory Visit: Payer: Self-pay | Admitting: Family Medicine

## 2017-08-27 DIAGNOSIS — Z1231 Encounter for screening mammogram for malignant neoplasm of breast: Secondary | ICD-10-CM

## 2017-08-30 DIAGNOSIS — R011 Cardiac murmur, unspecified: Secondary | ICD-10-CM | POA: Diagnosis not present

## 2017-08-30 DIAGNOSIS — R079 Chest pain, unspecified: Secondary | ICD-10-CM | POA: Diagnosis not present

## 2017-08-30 DIAGNOSIS — M6248 Contracture of muscle, other site: Secondary | ICD-10-CM | POA: Diagnosis not present

## 2017-09-02 ENCOUNTER — Telehealth: Payer: Self-pay | Admitting: *Deleted

## 2017-09-02 NOTE — Telephone Encounter (Signed)
ECHO referral from [facility] [employer] placed in scheduling box. [phone]  [phone]

## 2017-09-04 ENCOUNTER — Other Ambulatory Visit (HOSPITAL_COMMUNITY): Payer: Self-pay | Admitting: Family Medicine

## 2017-09-04 DIAGNOSIS — R011 Cardiac murmur, unspecified: Secondary | ICD-10-CM

## 2017-09-05 ENCOUNTER — Telehealth: Payer: Self-pay

## 2017-09-05 NOTE — Telephone Encounter (Signed)
Sent referral to scheduling and filed notes 

## 2017-09-12 ENCOUNTER — Ambulatory Visit
Admission: RE | Admit: 2017-09-12 | Discharge: 2017-09-12 | Disposition: A | Discharge status: Home / Self Care | Payer: BLUE CROSS/BLUE SHIELD | Source: Ambulatory Visit | Attending: Family Medicine | Admitting: Family Medicine

## 2017-09-12 DIAGNOSIS — Z1231 Encounter for screening mammogram for malignant neoplasm of breast: Secondary | ICD-10-CM

## 2017-09-13 ENCOUNTER — Other Ambulatory Visit: Payer: Self-pay | Admitting: Family Medicine

## 2017-09-13 DIAGNOSIS — R928 Other abnormal and inconclusive findings on diagnostic imaging of breast: Secondary | ICD-10-CM

## 2017-09-24 ENCOUNTER — Other Ambulatory Visit: Payer: Self-pay | Admitting: Family Medicine

## 2017-09-24 ENCOUNTER — Ambulatory Visit
Admission: RE | Admit: 2017-09-24 | Discharge: 2017-09-24 | Disposition: A | Discharge status: Home / Self Care | Payer: BLUE CROSS/BLUE SHIELD | Source: Ambulatory Visit | Attending: Family Medicine | Admitting: Family Medicine

## 2017-09-24 DIAGNOSIS — R928 Other abnormal and inconclusive findings on diagnostic imaging of breast: Secondary | ICD-10-CM

## 2017-09-24 DIAGNOSIS — R921 Mammographic calcification found on diagnostic imaging of breast: Secondary | ICD-10-CM | POA: Diagnosis not present

## 2017-09-26 ENCOUNTER — Ambulatory Visit (HOSPITAL_COMMUNITY): Payer: BLUE CROSS/BLUE SHIELD | Attending: Cardiology

## 2017-09-26 ENCOUNTER — Other Ambulatory Visit: Payer: Self-pay

## 2017-09-26 DIAGNOSIS — R011 Cardiac murmur, unspecified: Secondary | ICD-10-CM | POA: Insufficient documentation

## 2017-11-26 DIAGNOSIS — Z23 Encounter for immunization: Secondary | ICD-10-CM | POA: Diagnosis not present

## 2017-12-10 DIAGNOSIS — J019 Acute sinusitis, unspecified: Secondary | ICD-10-CM | POA: Diagnosis not present

## 2017-12-10 DIAGNOSIS — R030 Elevated blood-pressure reading, without diagnosis of hypertension: Secondary | ICD-10-CM | POA: Diagnosis not present

## 2018-01-16 DIAGNOSIS — B349 Viral infection, unspecified: Secondary | ICD-10-CM | POA: Diagnosis not present

## 2018-02-05 DIAGNOSIS — M1711 Unilateral primary osteoarthritis, right knee: Secondary | ICD-10-CM | POA: Diagnosis not present

## 2018-03-28 ENCOUNTER — Other Ambulatory Visit: Payer: Self-pay | Admitting: Family Medicine

## 2018-03-28 ENCOUNTER — Ambulatory Visit
Admission: RE | Admit: 2018-03-28 | Discharge: 2018-03-28 | Disposition: A | Discharge status: Home / Self Care | Payer: BLUE CROSS/BLUE SHIELD | Source: Ambulatory Visit | Attending: Family Medicine | Admitting: Family Medicine

## 2018-03-28 DIAGNOSIS — R921 Mammographic calcification found on diagnostic imaging of breast: Secondary | ICD-10-CM

## 2018-05-29 DIAGNOSIS — I10 Essential (primary) hypertension: Secondary | ICD-10-CM | POA: Diagnosis not present

## 2018-05-29 DIAGNOSIS — E781 Pure hyperglyceridemia: Secondary | ICD-10-CM | POA: Diagnosis not present

## 2018-05-29 DIAGNOSIS — E1165 Type 2 diabetes mellitus with hyperglycemia: Secondary | ICD-10-CM | POA: Diagnosis not present

## 2018-05-29 DIAGNOSIS — E119 Type 2 diabetes mellitus without complications: Secondary | ICD-10-CM | POA: Diagnosis not present

## 2018-10-01 ENCOUNTER — Ambulatory Visit
Admission: RE | Admit: 2018-10-01 | Discharge: 2018-10-01 | Disposition: A | Discharge status: Home / Self Care | Payer: 59 | Source: Ambulatory Visit | Attending: Family Medicine | Admitting: Family Medicine

## 2018-10-01 DIAGNOSIS — R921 Mammographic calcification found on diagnostic imaging of breast: Secondary | ICD-10-CM

## 2019-11-11 DIAGNOSIS — J019 Acute sinusitis, unspecified: Secondary | ICD-10-CM | POA: Diagnosis not present

## 2019-11-11 DIAGNOSIS — Z20828 Contact with and (suspected) exposure to other viral communicable diseases: Secondary | ICD-10-CM | POA: Diagnosis not present

## 2019-11-11 DIAGNOSIS — I10 Essential (primary) hypertension: Secondary | ICD-10-CM | POA: Diagnosis not present

## 2019-11-11 DIAGNOSIS — E1165 Type 2 diabetes mellitus with hyperglycemia: Secondary | ICD-10-CM | POA: Diagnosis not present

## 2019-11-12 ENCOUNTER — Other Ambulatory Visit: Payer: Self-pay | Admitting: Internal Medicine

## 2019-11-12 DIAGNOSIS — R921 Mammographic calcification found on diagnostic imaging of breast: Secondary | ICD-10-CM

## 2019-12-02 ENCOUNTER — Ambulatory Visit
Admission: RE | Admit: 2019-12-02 | Discharge: 2019-12-02 | Disposition: A | Discharge status: Home / Self Care | Payer: 59 | Source: Ambulatory Visit | Attending: Internal Medicine | Admitting: Internal Medicine

## 2019-12-02 ENCOUNTER — Other Ambulatory Visit: Payer: Self-pay

## 2019-12-02 DIAGNOSIS — R921 Mammographic calcification found on diagnostic imaging of breast: Secondary | ICD-10-CM | POA: Diagnosis not present

## 2019-12-29 ENCOUNTER — Ambulatory Visit: Payer: Self-pay | Admitting: Podiatry

## 2019-12-29 ENCOUNTER — Encounter: Payer: Self-pay | Admitting: Podiatry

## 2019-12-29 ENCOUNTER — Ambulatory Visit: Payer: Self-pay

## 2019-12-29 ENCOUNTER — Other Ambulatory Visit: Payer: Self-pay

## 2019-12-29 DIAGNOSIS — M722 Plantar fascial fibromatosis: Secondary | ICD-10-CM | POA: Diagnosis not present

## 2019-12-29 MED ORDER — MELOXICAM 15 MG PO TABS: 15.0000 mg | ORAL_TABLET | Freq: Every day | ORAL | 3 refills | Status: AC

## 2019-12-29 NOTE — Progress Notes (Signed)
Subjective:  Patient ID: Alison Sparks, female    DOB: [birth year],  MRN: [id number] HPI Chief Complaint  Patient presents with  . Plantar Fasciitis    Flare up of left heel - patient states she is working a new job at the [employer] 9 hours a day in dress shoes, also getting COVID booster today, so wondering about cortisone injection  . Toe Pain    Hallux bilateral - still having trouble with both borders of nails being ingrown-husband has been cutting them  . New Patient (Initial Visit)    Est pt 2018    49 y.o. female presents with the above complaint.   ROS: Denies fever chills nausea vomiting muscle aches pains calf pain back pain chest pain shortness of breath.  6 months ago her hemoglobin A1c was a 7.2 she is due to see her endocrinologist 25 October.  She thinks her blood sugars are higher.  She is going to get her Covid booster shot today.  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  Past Medical History:  Diagnosis Date  . Diabetes mellitus without complication (HCC)    No past surgical history on file.  Current Outpatient Medications:  .  atorvastatin (LIPITOR) 10 MG tablet, Take 10 mg by mouth daily., Disp: , Rfl:  .  BAYER CONTOUR NEXT TEST test strip, USE TO TEST BLOOD SUGAR THREE TIMES A DAY, Disp: , Rfl: 11 .  JANUVIA 100 MG tablet, Take 100 mg by mouth daily., Disp: , Rfl:  .  losartan (COZAAR) 25 MG tablet, , Disp: , Rfl: 10 .  meloxicam (MOBIC) 15 MG tablet, Take 1 tablet (15 mg total) by mouth daily., Disp: 30 tablet, Rfl: 3 .  metFORMIN (GLUCOPHAGE-XR) 500 MG 24 hr tablet, Take 500 mg by mouth 2 (two) times daily., Disp: , Rfl:   No Known Allergies Review of Systems Objective:  There were no vitals filed for this visit.  General: Well developed, nourished, in no  acute distress, alert and oriented x3   Dermatological: Skin is warm, dry and supple bilateral. Nails x 10 are well maintained; remaining integument appears unremarkable at this time. There are no open sores, no preulcerative lesions, no rash or signs of infection present.  Sharp innervated nail margins tibia-fibula borders of the hallux bilaterally no erythema edema cellulitis drainage or odor no pain.  Vascular: Dorsalis Pedis artery and Posterior Tibial artery pedal pulses are 2/4 bilateral with immedate capillary fill time. Pedal hair growth present. No varicosities and no lower extremity edema present bilateral.   Neruologic: Grossly intact via light touch bilateral. Vibratory intact via tuning fork bilateral. Protective threshold with Semmes Wienstein monofilament intact to all pedal sites bilateral. Patellar and Achilles deep tendon reflexes 2+ bilateral. No Babinski or clonus noted bilateral.   Musculoskeletal: No gross boney pedal deformities bilateral. No pain, crepitus, or limitation noted with foot and ankle range of motion bilateral. Muscular strength 5/5 in all groups tested bilateral.  Pain on palpation medial calcaneal tubercles bilateral.  No calf pain no Achilles pain  Gait: Unassisted, Nonantalgic.    Radiographs:  None taken  Assessment & Plan:   Assessment: Chronic intractable plantar fasciitis bilateral left greater than right.  Ingrown margins hallux nails bilateral.  Plan: Discussed etiology  pathology conservative versus surgical therapies until she knows her numbers particular hemoglobin A1c she does not want to have her ingrown nails removed.  At this point I explained to her that she was having her booster today I could not give her an injection of corticosteroid she understands and is amenable to it understands that he will be at least a month to 6 weeks.  Did write her prescription for anti-inflammatory to start in 2 weeks meloxicam 15 mg 1 p.o. daily also  dispensed plantar fascial braces bilaterally and she will follow up with [facility] for custom built orthotics dress style.  We also wrote notes to wear tennis shoes and recommended antifatigue mats.     Jacari Kirsten T. [place], [place]

## 2020-01-11 DIAGNOSIS — I10 Essential (primary) hypertension: Secondary | ICD-10-CM | POA: Diagnosis not present

## 2020-01-11 DIAGNOSIS — Z23 Encounter for immunization: Secondary | ICD-10-CM | POA: Diagnosis not present

## 2020-01-11 DIAGNOSIS — E1165 Type 2 diabetes mellitus with hyperglycemia: Secondary | ICD-10-CM | POA: Diagnosis not present

## 2020-01-11 DIAGNOSIS — E559 Vitamin D deficiency, unspecified: Secondary | ICD-10-CM | POA: Diagnosis not present

## 2020-01-11 DIAGNOSIS — E7800 Pure hypercholesterolemia, unspecified: Secondary | ICD-10-CM | POA: Diagnosis not present

## 2020-01-14 ENCOUNTER — Other Ambulatory Visit: Payer: Self-pay | Admitting: Orthotics

## 2020-03-24 DIAGNOSIS — Z20822 Contact with and (suspected) exposure to covid-19: Secondary | ICD-10-CM | POA: Diagnosis not present

## 2020-04-12 DIAGNOSIS — G4709 Other insomnia: Secondary | ICD-10-CM | POA: Diagnosis not present

## 2020-04-12 DIAGNOSIS — F419 Anxiety disorder, unspecified: Secondary | ICD-10-CM | POA: Diagnosis not present

## 2020-04-12 DIAGNOSIS — I10 Essential (primary) hypertension: Secondary | ICD-10-CM | POA: Diagnosis not present

## 2020-04-12 DIAGNOSIS — S161XXA Strain of muscle, fascia and tendon at neck level, initial encounter: Secondary | ICD-10-CM | POA: Diagnosis not present

## 2020-04-13 DIAGNOSIS — I10 Essential (primary) hypertension: Secondary | ICD-10-CM | POA: Diagnosis not present

## 2020-04-13 DIAGNOSIS — E1165 Type 2 diabetes mellitus with hyperglycemia: Secondary | ICD-10-CM | POA: Diagnosis not present

## 2020-04-13 DIAGNOSIS — E559 Vitamin D deficiency, unspecified: Secondary | ICD-10-CM | POA: Diagnosis not present

## 2020-04-13 DIAGNOSIS — E7800 Pure hypercholesterolemia, unspecified: Secondary | ICD-10-CM | POA: Diagnosis not present

## 2020-05-03 DIAGNOSIS — E785 Hyperlipidemia, unspecified: Secondary | ICD-10-CM | POA: Diagnosis not present

## 2020-05-03 DIAGNOSIS — E559 Vitamin D deficiency, unspecified: Secondary | ICD-10-CM | POA: Diagnosis not present

## 2020-05-03 DIAGNOSIS — Z0001 Encounter for general adult medical examination with abnormal findings: Secondary | ICD-10-CM | POA: Diagnosis not present

## 2020-05-05 DIAGNOSIS — Z0001 Encounter for general adult medical examination with abnormal findings: Secondary | ICD-10-CM | POA: Diagnosis not present

## 2020-05-05 DIAGNOSIS — Z01419 Encounter for gynecological examination (general) (routine) without abnormal findings: Secondary | ICD-10-CM | POA: Diagnosis not present

## 2020-05-05 DIAGNOSIS — E1165 Type 2 diabetes mellitus with hyperglycemia: Secondary | ICD-10-CM | POA: Diagnosis not present

## 2020-05-05 DIAGNOSIS — Z23 Encounter for immunization: Secondary | ICD-10-CM | POA: Diagnosis not present

## 2020-05-10 DIAGNOSIS — L0293 Carbuncle, unspecified: Secondary | ICD-10-CM | POA: Diagnosis not present

## 2020-05-10 DIAGNOSIS — Z23 Encounter for immunization: Secondary | ICD-10-CM | POA: Diagnosis not present

## 2020-05-10 DIAGNOSIS — I158 Other secondary hypertension: Secondary | ICD-10-CM | POA: Diagnosis not present

## 2020-06-06 DIAGNOSIS — Z130 Encounter for screening for diseases of the blood and blood-forming organs and certain disorders involving the immune mechanism: Secondary | ICD-10-CM | POA: Diagnosis not present

## 2020-06-06 DIAGNOSIS — Z124 Encounter for screening for malignant neoplasm of cervix: Secondary | ICD-10-CM | POA: Diagnosis not present

## 2020-06-06 DIAGNOSIS — Z01419 Encounter for gynecological examination (general) (routine) without abnormal findings: Secondary | ICD-10-CM | POA: Diagnosis not present

## 2020-06-06 DIAGNOSIS — Z1151 Encounter for screening for human papillomavirus (HPV): Secondary | ICD-10-CM | POA: Diagnosis not present

## 2020-07-14 DIAGNOSIS — I160 Hypertensive urgency: Secondary | ICD-10-CM | POA: Diagnosis not present

## 2020-07-14 DIAGNOSIS — R051 Acute cough: Secondary | ICD-10-CM | POA: Diagnosis not present

## 2020-07-14 DIAGNOSIS — J209 Acute bronchitis, unspecified: Secondary | ICD-10-CM | POA: Diagnosis not present

## 2020-07-14 DIAGNOSIS — J019 Acute sinusitis, unspecified: Secondary | ICD-10-CM | POA: Diagnosis not present

## 2020-07-17 DIAGNOSIS — R197 Diarrhea, unspecified: Secondary | ICD-10-CM | POA: Diagnosis not present

## 2020-07-17 DIAGNOSIS — N390 Urinary tract infection, site not specified: Secondary | ICD-10-CM | POA: Diagnosis not present

## 2020-07-17 DIAGNOSIS — R309 Painful micturition, unspecified: Secondary | ICD-10-CM | POA: Diagnosis not present

## 2020-07-17 DIAGNOSIS — J209 Acute bronchitis, unspecified: Secondary | ICD-10-CM | POA: Diagnosis not present

## 2020-07-19 DIAGNOSIS — U071 COVID-19: Secondary | ICD-10-CM | POA: Diagnosis not present

## 2020-08-18 DIAGNOSIS — R81 Glycosuria: Secondary | ICD-10-CM | POA: Diagnosis not present

## 2020-08-18 DIAGNOSIS — R309 Painful micturition, unspecified: Secondary | ICD-10-CM | POA: Diagnosis not present

## 2020-08-18 DIAGNOSIS — R350 Frequency of micturition: Secondary | ICD-10-CM | POA: Diagnosis not present

## 2020-09-15 DIAGNOSIS — J302 Other seasonal allergic rhinitis: Secondary | ICD-10-CM | POA: Diagnosis not present

## 2020-09-15 DIAGNOSIS — I10 Essential (primary) hypertension: Secondary | ICD-10-CM | POA: Diagnosis not present

## 2020-09-15 DIAGNOSIS — J019 Acute sinusitis, unspecified: Secondary | ICD-10-CM | POA: Diagnosis not present

## 2020-10-27 DIAGNOSIS — I10 Essential (primary) hypertension: Secondary | ICD-10-CM | POA: Diagnosis not present

## 2020-10-27 DIAGNOSIS — E1165 Type 2 diabetes mellitus with hyperglycemia: Secondary | ICD-10-CM | POA: Diagnosis not present

## 2020-10-28 DIAGNOSIS — R0981 Nasal congestion: Secondary | ICD-10-CM | POA: Diagnosis not present

## 2020-10-28 DIAGNOSIS — Z23 Encounter for immunization: Secondary | ICD-10-CM | POA: Diagnosis not present

## 2020-10-28 DIAGNOSIS — I10 Essential (primary) hypertension: Secondary | ICD-10-CM | POA: Diagnosis not present

## 2020-10-28 DIAGNOSIS — L659 Nonscarring hair loss, unspecified: Secondary | ICD-10-CM | POA: Diagnosis not present

## 2020-11-22 ENCOUNTER — Encounter: Payer: Self-pay | Admitting: Podiatry

## 2020-11-22 ENCOUNTER — Ambulatory Visit: Payer: BC Managed Care – PPO | Admitting: Podiatry

## 2020-11-22 ENCOUNTER — Other Ambulatory Visit: Payer: Self-pay

## 2020-11-22 ENCOUNTER — Telehealth: Payer: Self-pay | Admitting: *Deleted

## 2020-11-22 DIAGNOSIS — M722 Plantar fascial fibromatosis: Secondary | ICD-10-CM

## 2020-11-22 MED ORDER — DICLOFENAC SODIUM 1 % EX GEL: 4.0000 g | Freq: Four times a day (QID) | CUTANEOUS | 1 refills | Status: AC

## 2020-11-22 MED ORDER — DICLOFENAC SODIUM 75 MG PO TBEC: 75.0000 mg | DELAYED_RELEASE_TABLET | Freq: Two times a day (BID) | ORAL | 3 refills | Status: AC

## 2020-11-22 MED ORDER — TRIAMCINOLONE ACETONIDE 40 MG/ML IJ SUSP
40.0000 mg | Freq: Once | INTRAMUSCULAR | Status: AC
  Administered 2020-11-22: 40 mg

## 2020-11-22 NOTE — Progress Notes (Signed)
She states that that she is here for follow-up of her bilateral [place] fasciitis states that she did well for quite for quite some time and that her feet have been hurting her now for the past several months.  She is tried multiple new shoes but nothing really seems to help.  Objective: Vital signs are stable alert and oriented x3.  Pulses are palpable.  Still has pain on palpation medial calcaneal tubercles left hurts worse than the right when I palpated.  Assessment: Fasciitis.  Plan: Injected bilateral heels today and now placed her in her orthotics and recommended that she try naproxen twice daily or Voltaren tablets or diclofenac twice daily.  This point she states that she would like to try the naproxen.

## 2020-11-22 NOTE — Telephone Encounter (Signed)
Patient is calling because she thought that she was supposed to get the Voltaren tablets instead of the gel prescription. Please advise.

## 2020-12-15 DIAGNOSIS — Z23 Encounter for immunization: Secondary | ICD-10-CM | POA: Diagnosis not present

## 2020-12-27 DIAGNOSIS — R051 Acute cough: Secondary | ICD-10-CM | POA: Diagnosis not present

## 2020-12-27 DIAGNOSIS — J069 Acute upper respiratory infection, unspecified: Secondary | ICD-10-CM | POA: Diagnosis not present

## 2020-12-27 DIAGNOSIS — I10 Essential (primary) hypertension: Secondary | ICD-10-CM | POA: Diagnosis not present

## 2021-01-02 ENCOUNTER — Other Ambulatory Visit: Payer: Self-pay | Admitting: Internal Medicine

## 2021-01-02 DIAGNOSIS — Z1231 Encounter for screening mammogram for malignant neoplasm of breast: Secondary | ICD-10-CM

## 2021-01-12 ENCOUNTER — Ambulatory Visit
Admission: RE | Admit: 2021-01-12 | Discharge: 2021-01-12 | Disposition: A | Discharge status: Home / Self Care | Payer: BC Managed Care – PPO | Source: Ambulatory Visit | Attending: Internal Medicine | Admitting: Internal Medicine

## 2021-01-12 ENCOUNTER — Other Ambulatory Visit: Payer: Self-pay

## 2021-01-12 DIAGNOSIS — Z1231 Encounter for screening mammogram for malignant neoplasm of breast: Secondary | ICD-10-CM

## 2021-01-12 DIAGNOSIS — N921 Excessive and frequent menstruation with irregular cycle: Secondary | ICD-10-CM | POA: Diagnosis not present

## 2021-01-12 DIAGNOSIS — N939 Abnormal uterine and vaginal bleeding, unspecified: Secondary | ICD-10-CM | POA: Diagnosis not present

## 2021-02-24 ENCOUNTER — Ambulatory Visit: Payer: Self-pay

## 2021-02-24 ENCOUNTER — Ambulatory Visit (INDEPENDENT_AMBULATORY_CARE_PROVIDER_SITE_OTHER): Payer: 59 | Admitting: Rheumatology

## 2021-02-24 ENCOUNTER — Other Ambulatory Visit: Payer: Self-pay

## 2021-02-24 ENCOUNTER — Encounter: Payer: Self-pay | Admitting: Rheumatology

## 2021-02-24 VITALS — BP 174/103 | HR 97 | Ht 64.5 in | Wt 168.4 lb

## 2021-02-24 DIAGNOSIS — M542 Cervicalgia: Secondary | ICD-10-CM

## 2021-02-24 DIAGNOSIS — I10 Essential (primary) hypertension: Secondary | ICD-10-CM

## 2021-02-24 DIAGNOSIS — G8929 Other chronic pain: Secondary | ICD-10-CM

## 2021-02-24 DIAGNOSIS — M25511 Pain in right shoulder: Secondary | ICD-10-CM

## 2021-02-24 DIAGNOSIS — Z8639 Personal history of other endocrine, nutritional and metabolic disease: Secondary | ICD-10-CM | POA: Insufficient documentation

## 2021-02-24 DIAGNOSIS — E785 Hyperlipidemia, unspecified: Secondary | ICD-10-CM

## 2021-02-24 NOTE — Progress Notes (Signed)
Office Visit Note  Patient: Alison Sparks             Date of Birth: Jun 05, 1970           MRN: [id number]             PCP: Audley Hose, MD Referring: Bo Merino, MD Visit Date: 02/24/2021 Occupation: @GUAROCC @  Subjective:  Neck and right shoulder pain   History of Present Illness: Alison Sparks is a 50 y.o. female seen in consultation per request of her PCP.  According the patient she started having left shoulder joint pain about 2-1/2 years ago while she was pulling on her dog's leash.  At the time she had cardiology work-up which was all negative.  The shoulder joint discomfort resolved after some time.  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  In November 2022 she started having pain in her neck and her right shoulder.  She states she took diclofenac and her symptoms improved for a few days.  The pain has recurred and became more intense.  She describes discomfort in her neck which radiates down to her shoulder she also has discomfort in her shoulder.  The pain goes down to her mid arm which came and elbow.  None of the other joints are painful.  No history of psoriasis.  There is no family history of autoimmune disease.  She is gravida 2, para 2, miscarriages 0.  She denies any history of DVTs.   Activities of Daily Living:  Patient reports morning stiffness for 0 minutes.   Patient Reports nocturnal pain.  Difficulty dressing/grooming: Denies Difficulty climbing stairs: Denies Difficulty getting out of chair: Denies Difficulty using hands for taps, buttons, cutlery, and/or writing: Denies  Review of Systems  Constitutional:  Negative for fatigue.  HENT:  Negative for mouth sores, mouth dryness and nose dryness.   Eyes:  Negative for pain, itching and dryness.  Respiratory:  Negative for shortness of breath and difficulty breathing.   Cardiovascular:  Negative for chest pain and palpitations.   Gastrointestinal:  Negative for blood in stool, constipation and diarrhea.  Endocrine: Negative for increased urination.  Genitourinary:  Negative for difficulty urinating.  Musculoskeletal:  Positive for joint pain, joint pain, myalgias and myalgias. Negative for joint swelling and morning stiffness.  Skin:  Negative for color change, rash and redness.  Allergic/Immunologic: Negative for susceptible to infections.  Neurological:  Negative for dizziness, numbness, headaches, memory loss and weakness.  Hematological:  Negative for bruising/bleeding tendency.  Psychiatric/Behavioral:  Negative for confusion.    PMFS History:  Patient Active Problem List   Diagnosis Date Noted   Essential hypertension 02/24/2021   History of diabetes mellitus 02/24/2021   Dyslipidemia 02/24/2021    Past Medical History:  Diagnosis Date   Diabetes mellitus without complication ([place])     Family History  Problem Relation Age of Onset   Diabetes Mother    Hypertension Mother    Diabetes Father    Heart disease Brother    Breast cancer Maternal Aunt    Healthy Son    Healthy Daughter    History reviewed. No pertinent surgical history. Social History   Social History Narrative   Not on file   Immunization History  Administered Date(s) Administered   Influenza-Unspecified 12/03/2018     Objective: Vital Signs: BP (!) 174/103 (BP Location: Left Arm, Patient Position: Sitting, Cuff Size: Normal)   Pulse  97   Ht 5' 4.5" (1.638 m)   Wt 168 lb 6.4 oz (76.4 kg)   BMI 28.46 kg/m    Physical Exam Vitals and nursing note reviewed.  Constitutional:      Appearance: She is well-developed.  HENT:     Head: Normocephalic and atraumatic.  Eyes:     Conjunctiva/sclera: Conjunctivae normal.  Cardiovascular:     Rate and Rhythm: Normal rate and regular rhythm.     Heart sounds: Normal heart sounds.  Pulmonary:     Effort: Pulmonary effort is normal.     Breath sounds: Normal breath sounds.   Abdominal:     General: Bowel sounds are normal.     Palpations: Abdomen is soft.  Musculoskeletal:     Cervical back: Normal range of motion.  Lymphadenopathy:     Cervical: No cervical adenopathy.  Skin:    General: Skin is warm and dry.     Capillary Refill: Capillary refill takes less than 2 seconds.  Neurological:     Mental Status: She is alert and oriented to person, place, and time.  Psychiatric:        Behavior: Behavior normal.     Musculoskeletal Exam: She had discomfort with right lateral rotation.  She also had radiculopathy on flexion and extension of her cervical spine.  Right shoulder joint was in good range of motion with some discomfort.  She also had tenderness over the distal portion of the arm.  There was no tenderness over epicondyle region.  Left elbow joint was in good range of motion.  Elbow joints, wrist joints, MCPs PIPs and DIPs with good range of motion with no synovitis.  Hip joints, knee joints, ankles, MTPs and PIPs with good range of motion with no synovitis.  CDAI Exam: CDAI Score: -- Patient Global: --; Provider Global: -- Swollen: --; Tender: -- Joint Exam 02/24/2021   No joint exam has been documented for this visit   There is currently no information documented on the homunculus. Go to the Rheumatology activity and complete the homunculus joint exam.  Investigation: No additional findings.  Imaging: No results found.  Recent Labs: Lab Results  Component Value Date   HGB 14.3 08/23/2009   NA 136 08/23/2009   K 4.0 08/23/2009   CL 104 08/23/2009   GLUCOSE 264 (H) 08/23/2009   BUN 4 (L) 08/23/2009   CREATININE 0.4 08/23/2009    Speciality Comments: No specialty comments available.  Procedures:  No procedures performed Allergies: Empagliflozin-metformin hcl and Metformin hcl   Assessment / Plan:     Visit Diagnoses: Neck pain -she has been having pain and stiffness in her cervical spine.  She works as a Librarian, academic for several hours.  She has been experiencing right-sided radiculopathy.  Plan: XR Cervical Spine 2 or 3 views.  X-rays are consistent with spondylosis and facet joint arthropathy.  X-ray findings were discussed with the patient.  I will refer her to physical therapy.  A handout on C-spine stretching exercises was given.  I also advised her to take muscle relaxer.  She has methocarbamol at home.  Chronic right shoulder pain -she complains of right shoulder joint pain.  She had good range of motion of her right shoulder joint.  A handout on shoulder joint exercises was given.  She also had some discomfort over the distal portion of her right arm.  She had no tenderness over the epicondyle region.  Plan: XR Shoulder Right.  X-rays  were unremarkable.  X-ray findings were discussed with the patient.  She is also having some discomfort over the distal arm.  X-rays of the distal humerus were unremarkable.  I advised her to use topical analgesic.  Essential hypertension - She is on losartan.  Her blood pressure was elevated.  She was advised to monitor blood pressure closely.  She stated  that she has whitecoat syndrome.  She will monitor blood pressure at home.  History of diabetes mellitus - She is on Rybelsus and metformin  Dyslipidemia - She is on atorvastatin  Orders: Orders Placed This Encounter  Procedures   XR Cervical Spine 2 or 3 views   XR Shoulder Right    No orders of the defined types were placed in this encounter.  Swallow days and I will let you  Follow-Up Instructions: Return in about 6 weeks (around 04/07/2021) for Neck pain, shoulder pain.   Bo Merino, MD  Note - This record has been created using Editor, commissioning.  Chart creation errors have been sought, but may not always  have been located. Such creation errors do not reflect on  the standard of medical care.

## 2021-02-24 NOTE — Addendum Note (Signed)
Addended by: Ellen Henri on: 02/24/2021 02:04 PM   Modules accepted: Orders

## 2021-02-24 NOTE — Patient Instructions (Signed)
Cervical Strain and Sprain Rehab Ask your health care provider which exercises are safe for you. Do exercises exactly as told by your health care provider and adjust them as directed. It is normal to feel mild stretching, pulling, tightness, or discomfort as you do these exercises. Stop right away if you feel sudden pain or your pain gets worse. Do not begin these exercises until told by your health care provider. Stretching and range-of-motion exercises Cervical side bending  Using good posture, sit on a stable chair or stand up. Without moving your shoulders, slowly tilt your left / right ear to your shoulder until you feel a stretch in the opposite side neck muscles. You should be looking straight ahead. Hold for __________ seconds. Repeat with the other side of your neck. Repeat __________ times. Complete this exercise __________ times a day. Cervical rotation  Using good posture, sit on a stable chair or stand up. Slowly turn your head to the side as if you are looking over your left / right shoulder. Keep your eyes level with the ground. Stop when you feel a stretch along the side and the back of your neck. Hold for __________ seconds. Repeat this by turning to your other side. Repeat __________ times. Complete this exercise __________ times a day. Thoracic extension and pectoral stretch Roll a towel or a small blanket so it is about 4 inches (10 cm) in diameter. Lie down on your back on a firm surface. Put the towel lengthwise, under your spine in the middle of your back. It should not be under your shoulder blades. The towel should line up with your spine from your middle back to your lower back. Put your hands behind your head and let your elbows fall out to your sides. Hold for __________ seconds. Repeat __________ times. Complete this exercise __________ times a day. Strengthening exercises Isometric upper cervical flexion Lie on your back with a thin pillow behind your head  and a small rolled-up towel under your neck. Gently tuck your chin toward your chest and nod your head down to look toward your feet. Do not lift your head off the pillow. Hold for __________ seconds. Release the tension slowly. Relax your neck muscles completely before you repeat this exercise. Repeat __________ times. Complete this exercise __________ times a day. Isometric cervical extension  Stand about 6 inches (15 cm) away from a wall, with your back facing the wall. Place a soft object, about 6-8 inches (15-20 cm) in diameter, between the back of your head and the wall. A soft object could be a small pillow, a ball, or a folded towel. Gently tilt your head back and press into the soft object. Keep your jaw and forehead relaxed. Hold for __________ seconds. Release the tension slowly. Relax your neck muscles completely before you repeat this exercise. Repeat __________ times. Complete this exercise __________ times a day. Posture and body mechanics Body mechanics refers to the movements and positions of your body while you do your daily activities. Posture is part of body mechanics. Good posture and healthy body mechanics can help to relieve stress in your body's tissues and joints. Good posture means that your spine is in its natural S-curve position (your spine is neutral), your shoulders are pulled back slightly, and your head is not tipped forward. The following are general guidelines for applying improved posture and body mechanics to your everyday activities. Sitting  When sitting, keep your spine neutral and keep your feet flat on the floor.   Use a footrest, if necessary, and keep your thighs parallel to the floor. Avoid rounding your shoulders, and avoid tilting your head forward. When working at a desk or a computer, keep your desk at a height where your hands are slightly lower than your elbows. Slide your chair under your desk so you are close enough to maintain good posture. When  working at a computer, place your monitor at a height where you are looking straight ahead and you do not have to tilt your head forward or downward to look at the screen. Standing  When standing, keep your spine neutral and keep your feet about hip-width apart. Keep a slight bend in your knees. Your ears, shoulders, and hips should line up. When you do a task in which you stand in one place for a long time, place one foot up on a stable object that is 2-4 inches (5-10 cm) high, such as a footstool. This helps keep your spine neutral. Resting When lying down and resting, avoid positions that are most painful for you. Try to support your neck in a neutral position. You can use a contour pillow or a small rolled-up towel. Your pillow should support your neck but not push on it. This information is not intended to replace advice given to you by your health care provider. Make sure you discuss any questions you have with your health care provider. Document Revised: 06/25/2018 Document Reviewed: 12/04/2017 Elsevier Patient Education  2022 [employer] Shoulder Exercises Ask your health care provider which exercises are safe for you. Do exercises exactly as told by your health care provider and adjust them as directed. It is normal to feel mild stretching, pulling, tightness, or discomfort as you do these exercises. Stop right away if you feel sudden pain or your pain gets worse. Do not begin these exercises until told by your health care provider. Stretching exercises External rotation and abduction This exercise is sometimes called corner stretch. This exercise rotates your arm outward (external rotation) and moves your arm out from your body (abduction). Stand in a doorway with one of your feet slightly in front of the other. This is called a staggered stance. If you cannot reach your forearms to the door frame, stand facing a corner of a room. Choose one of the following positions as told by your  health care provider: Place your hands and forearms on the door frame above your head. Place your hands and forearms on the door frame at the height of your head. Place your hands on the door frame at the height of your elbows. Slowly move your weight onto your front foot until you feel a stretch across your chest and in the front of your shoulders. Keep your head and chest upright and keep your abdominal muscles tight. Hold for __________ seconds. To release the stretch, shift your weight to your back foot. Repeat __________ times. Complete this exercise __________ times a day. Extension, standing Stand and hold a broomstick, a cane, or a similar object behind your back. Your hands should be a little wider than shoulder width apart. Your palms should face away from your back. Keeping your elbows straight and your shoulder muscles relaxed, move the stick away from your body until you feel a stretch in your shoulders (extension). Avoid shrugging your shoulders while you move the stick. Keep your shoulder blades tucked down toward the middle of your back. Hold for __________ seconds. Slowly return to the starting position. Repeat __________ times. Complete   this exercise __________ times a day. Range-of-motion exercises Pendulum  Stand near a wall or a surface that you can hold onto for balance. Bend at the waist and let your left / right arm hang straight down. Use your other arm to support you. Keep your back straight and do not lock your knees. Relax your left / right arm and shoulder muscles, and move your hips and your trunk so your left / right arm swings freely. Your arm should swing because of the motion of your body, not because you are using your arm or shoulder muscles. Keep moving your hips and trunk so your arm swings in the following directions, as told by your health care provider: Side to side. Forward and backward. In clockwise and counterclockwise circles. Continue each  motion for __________ seconds, or for as long as told by your health care provider. Slowly return to the starting position. Repeat __________ times. Complete this exercise __________ times a day. Shoulder flexion, standing  Stand and hold a broomstick, a cane, or a similar object. Place your hands a little more than shoulder width apart on the object. Your left / right hand should be palm up, and your other hand should be palm down. Keep your elbow straight and your shoulder muscles relaxed. Push the stick up with your healthy arm to raise your left / right arm in front of your body, and then over your head until you feel a stretch in your shoulder (flexion). Avoid shrugging your shoulder while you raise your arm. Keep your shoulder blade tucked down toward the middle of your back. Hold for __________ seconds. Slowly return to the starting position. Repeat __________ times. Complete this exercise __________ times a day. Shoulder abduction, standing Stand and hold a broomstick, a cane, or a similar object. Place your hands a little more than shoulder width apart on the object. Your left / right hand should be palm up, and your other hand should be palm down. Keep your elbow straight and your shoulder muscles relaxed. Push the object across your body toward your left / right side. Raise your left / right arm to the side of your body (abduction) until you feel a stretch in your shoulder. Do not raise your arm above shoulder height unless your health care provider tells you to do that. If directed, raise your arm over your head. Avoid shrugging your shoulder while you raise your arm. Keep your shoulder blade tucked down toward the middle of your back. Hold for __________ seconds. Slowly return to the starting position. Repeat __________ times. Complete this exercise __________ times a day. Internal rotation  Place your left / right hand behind your back, palm up. Use your other hand to dangle an  exercise band, a towel, or a similar object over your shoulder. Grasp the band with your left / right hand so you are holding on to both ends. Gently pull up on the band until you feel a stretch in the front of your left / right shoulder. The movement of your arm toward the center of your body is called internal rotation. Avoid shrugging your shoulder while you raise your arm. Keep your shoulder blade tucked down toward the middle of your back. Hold for __________ seconds. Release the stretch by letting go of the band and lowering your hands. Repeat __________ times. Complete this exercise __________ times a day. Strengthening exercises External rotation  Sit in a stable chair without armrests. Secure an exercise band to a stable   object at elbow height on your left / right side. Place a soft object, such as a folded towel or a small pillow, between your left / right upper arm and your body to move your elbow about 4 inches (10 cm) away from your side. Hold the end of the exercise band so it is tight and there is no slack. Keeping your elbow pressed against the soft object, slowly move your forearm out, away from your abdomen (external rotation). Keep your body steady so only your forearm moves. Hold for __________ seconds. Slowly return to the starting position. Repeat __________ times. Complete this exercise __________ times a day. Shoulder abduction  Sit in a stable chair without armrests, or stand up. Hold a __________ weight in your left / right hand, or hold an exercise band with both hands. Start with your arms straight down and your left / right palm facing in, toward your body. Slowly lift your left / right hand out to your side (abduction). Do not lift your hand above shoulder height unless your health care provider tells you that this is safe. Keep your arms straight. Avoid shrugging your shoulder while you do this movement. Keep your shoulder blade tucked down toward the middle of  your back. Hold for __________ seconds. Slowly lower your arm, and return to the starting position. Repeat __________ times. Complete this exercise __________ times a day. Shoulder extension Sit in a stable chair without armrests, or stand up. Secure an exercise band to a stable object in front of you so it is at shoulder height. Hold one end of the exercise band in each hand. Your palms should face each other. Straighten your elbows and lift your hands up to shoulder height. Step back, away from the secured end of the exercise band, until the band is tight and there is no slack. Squeeze your shoulder blades together as you pull your hands down to the sides of your thighs (extension). Stop when your hands are straight down by your sides. Do not let your hands go behind your body. Hold for __________ seconds. Slowly return to the starting position. Repeat __________ times. Complete this exercise __________ times a day. Shoulder row Sit in a stable chair without armrests, or stand up. Secure an exercise band to a stable object in front of you so it is at waist height. Hold one end of the exercise band in each hand. Position your palms so that your thumbs are facing the ceiling (neutral position). Bend each of your elbows to a 90-degree angle (right angle) and keep your upper arms at your sides. Step back until the band is tight and there is no slack. Slowly pull your elbows back behind you. Hold for __________ seconds. Slowly return to the starting position. Repeat __________ times. Complete this exercise __________ times a day. Shoulder press-ups  Sit in a stable chair that has armrests. Sit upright, with your feet flat on the floor. Put your hands on the armrests so your elbows are bent and your fingers are pointing forward. Your hands should be about even with the sides of your body. Push down on the armrests and use your arms to lift yourself off the chair. Straighten your elbows and  lift yourself up as much as you comfortably can. Move your shoulder blades down, and avoid letting your shoulders move up toward your ears. Keep your feet on the ground. As you get stronger, your feet should support less of your body weight as you lift   yourself up. Hold for __________ seconds. Slowly lower yourself back into the chair. Repeat __________ times. Complete this exercise __________ times a day. Wall push-ups  Stand so you are facing a stable wall. Your feet should be about one arm-length away from the wall. Lean forward and place your palms on the wall at shoulder height. Keep your feet flat on the floor as you bend your elbows and lean forward toward the wall. Hold for __________ seconds. Straighten your elbows to push yourself back to the starting position. Repeat __________ times. Complete this exercise __________ times a day. This information is not intended to replace advice given to you by your health care provider. Make sure you discuss any questions you have with your health care provider. Document Revised: 06/27/2018 Document Reviewed: 04/04/2018 Elsevier Patient Education  2022 [employer]  

## 2021-03-02 ENCOUNTER — Telehealth: Payer: Self-pay | Admitting: Rheumatology

## 2021-03-02 NOTE — Telephone Encounter (Signed)
Referral and demographics have been re-faxed to [facility] to number provided.

## 2021-03-02 NOTE — Telephone Encounter (Signed)
Alison Sparks from [facility] left a voicemail wishing to return [place] call regarding Alison Sparks's referral. Alison Sparks stated that they did not receive Alison Sparks's referral. They request it be faxed to [phone]

## 2021-03-06 ENCOUNTER — Ambulatory Visit: Payer: BC Managed Care – PPO | Admitting: Rheumatology

## 2021-04-12 ENCOUNTER — Ambulatory Visit: Payer: 59 | Admitting: Rheumatology

## 2022-01-30 ENCOUNTER — Other Ambulatory Visit: Payer: Self-pay | Admitting: Internal Medicine

## 2022-01-30 DIAGNOSIS — Z1231 Encounter for screening mammogram for malignant neoplasm of breast: Secondary | ICD-10-CM

## 2022-02-12 ENCOUNTER — Other Ambulatory Visit (HOSPITAL_BASED_OUTPATIENT_CLINIC_OR_DEPARTMENT_OTHER): Payer: Self-pay

## 2022-02-12 DIAGNOSIS — J00 Acute nasopharyngitis [common cold]: Secondary | ICD-10-CM | POA: Diagnosis not present

## 2022-02-12 DIAGNOSIS — J209 Acute bronchitis, unspecified: Secondary | ICD-10-CM | POA: Diagnosis not present

## 2022-02-12 DIAGNOSIS — R070 Pain in throat: Secondary | ICD-10-CM | POA: Diagnosis not present

## 2022-02-12 DIAGNOSIS — R059 Cough, unspecified: Secondary | ICD-10-CM | POA: Diagnosis not present

## 2022-02-12 MED ORDER — BENZONATATE 200 MG PO CAPS
200.0000 mg | ORAL_CAPSULE | Freq: Three times a day (TID) | ORAL | 0 refills | Status: AC
  Filled 2022-02-12: qty 21, 7d supply, fill #0

## 2022-02-13 ENCOUNTER — Other Ambulatory Visit (HOSPITAL_BASED_OUTPATIENT_CLINIC_OR_DEPARTMENT_OTHER): Payer: Self-pay

## 2022-02-14 ENCOUNTER — Other Ambulatory Visit (HOSPITAL_BASED_OUTPATIENT_CLINIC_OR_DEPARTMENT_OTHER): Payer: Self-pay

## 2022-02-15 ENCOUNTER — Other Ambulatory Visit (HOSPITAL_BASED_OUTPATIENT_CLINIC_OR_DEPARTMENT_OTHER): Payer: Self-pay

## 2022-02-15 MED ORDER — METFORMIN HCL ER 500 MG PO TB24
500.0000 mg | ORAL_TABLET | Freq: Two times a day (BID) | ORAL | 1 refills | Status: DC
  Filled 2022-02-15: qty 180, 90d supply, fill #0
  Filled 2022-05-31: qty 180, 90d supply, fill #1

## 2022-02-19 IMAGING — MG MM DIGITAL SCREENING BILAT W/ TOMO AND CAD
6 of 10 series · 6 of 30 positions shown · non-contrast
Comparison: Previous exam(s).

CLINICAL DATA: Screening.

EXAM:
DIGITAL SCREENING BILATERAL MAMMOGRAM WITH TOMOSYNTHESIS AND CAD
TECHNIQUE: Bilateral screening digital craniocaudal and mediolateral oblique
mammograms were obtained. Bilateral screening digital breast
tomosynthesis was performed. The images were evaluated with
computer-aided detection.

[L CC synth-2D]
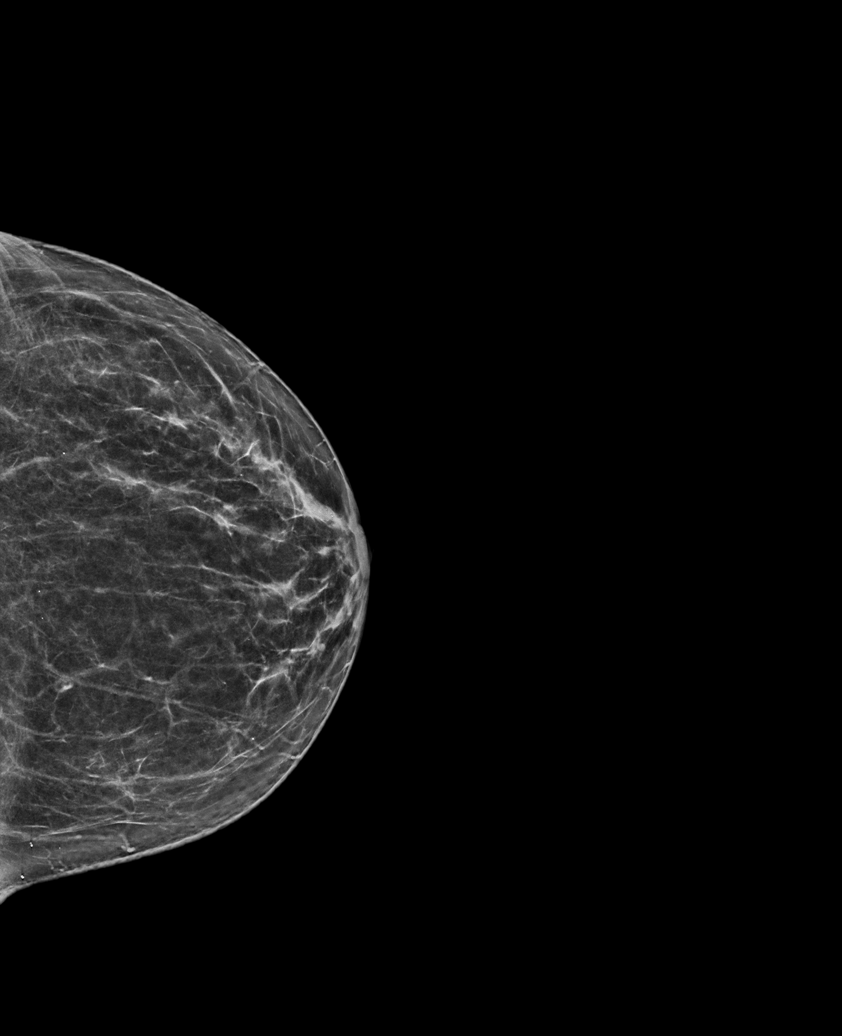

[L MLO synth-2D]
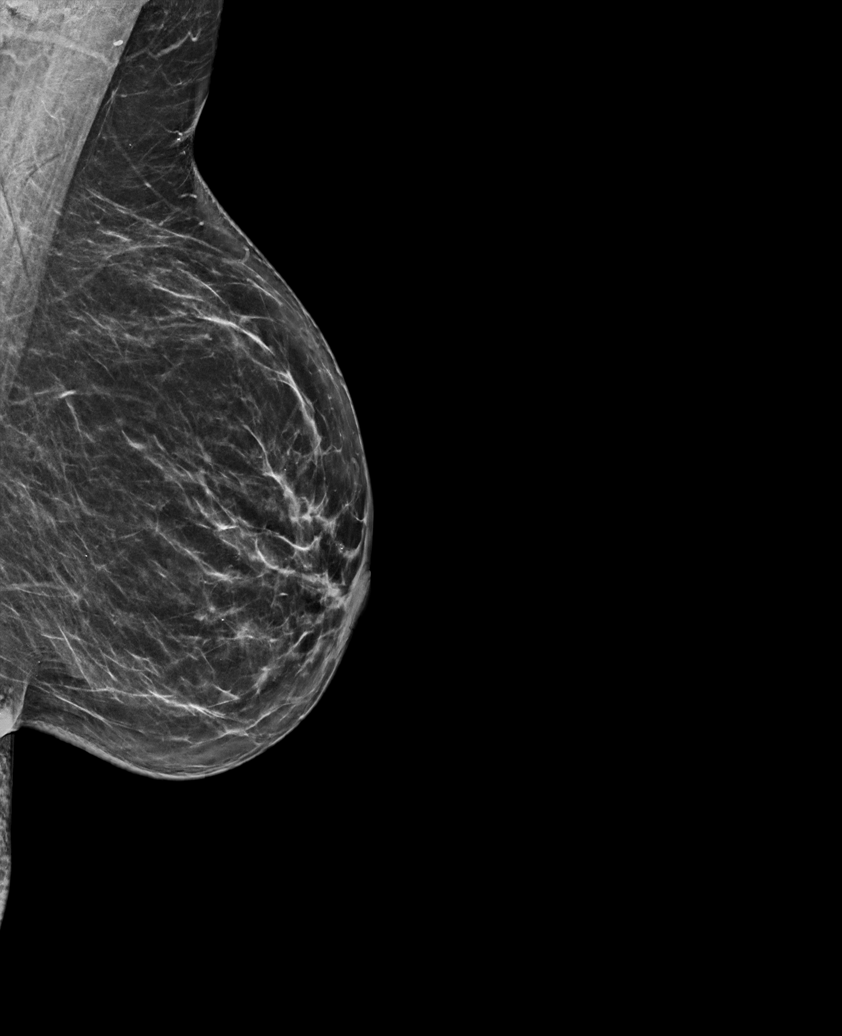

[R CC synth-2D (1 of 2)]
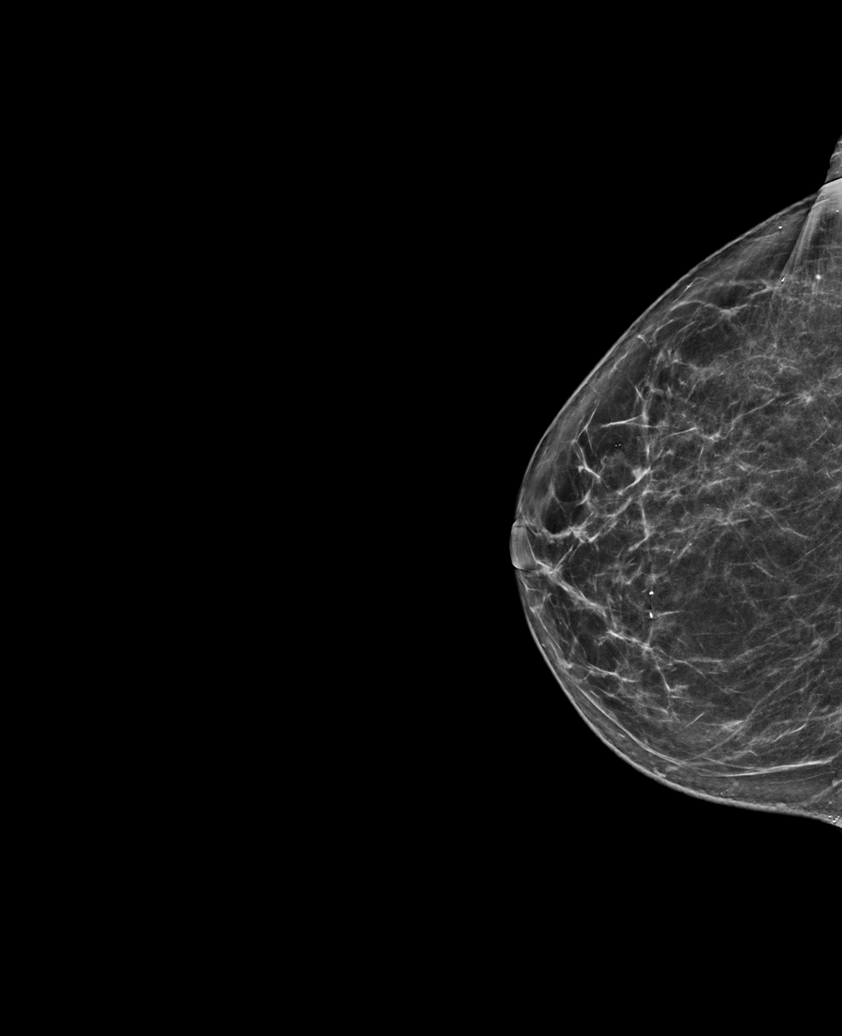

[R CC synth-2D (2 of 2)]
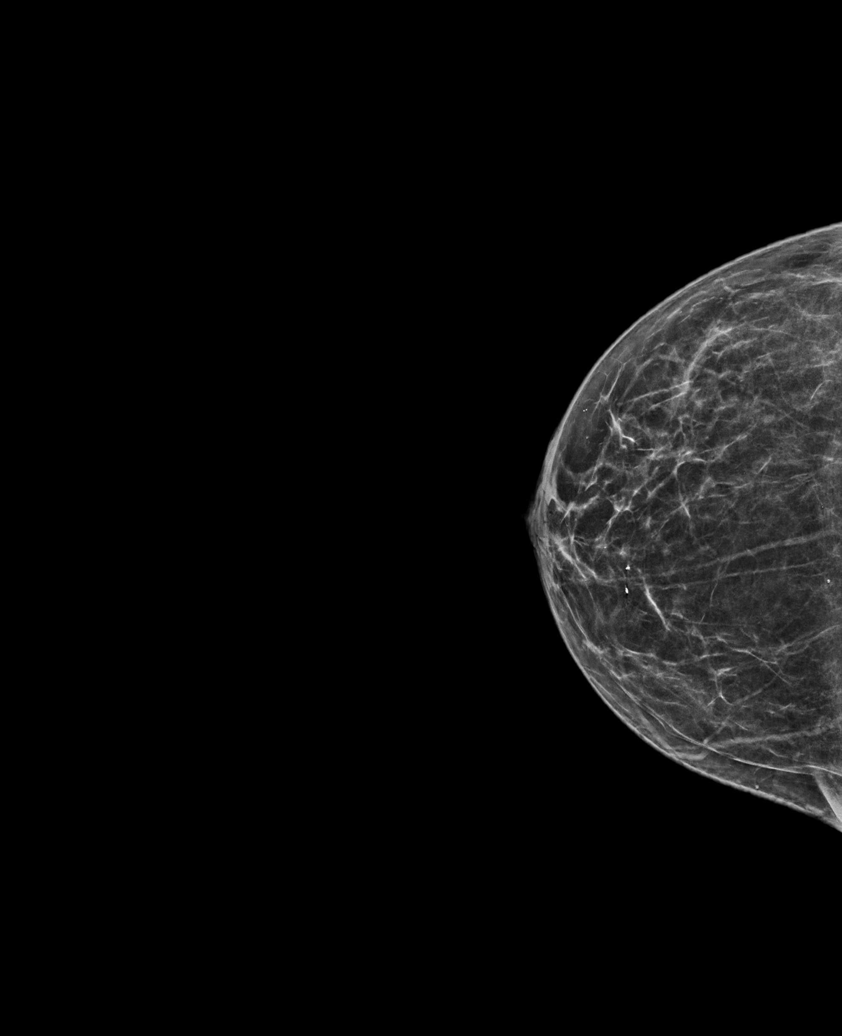

[R MLO synth-2D]
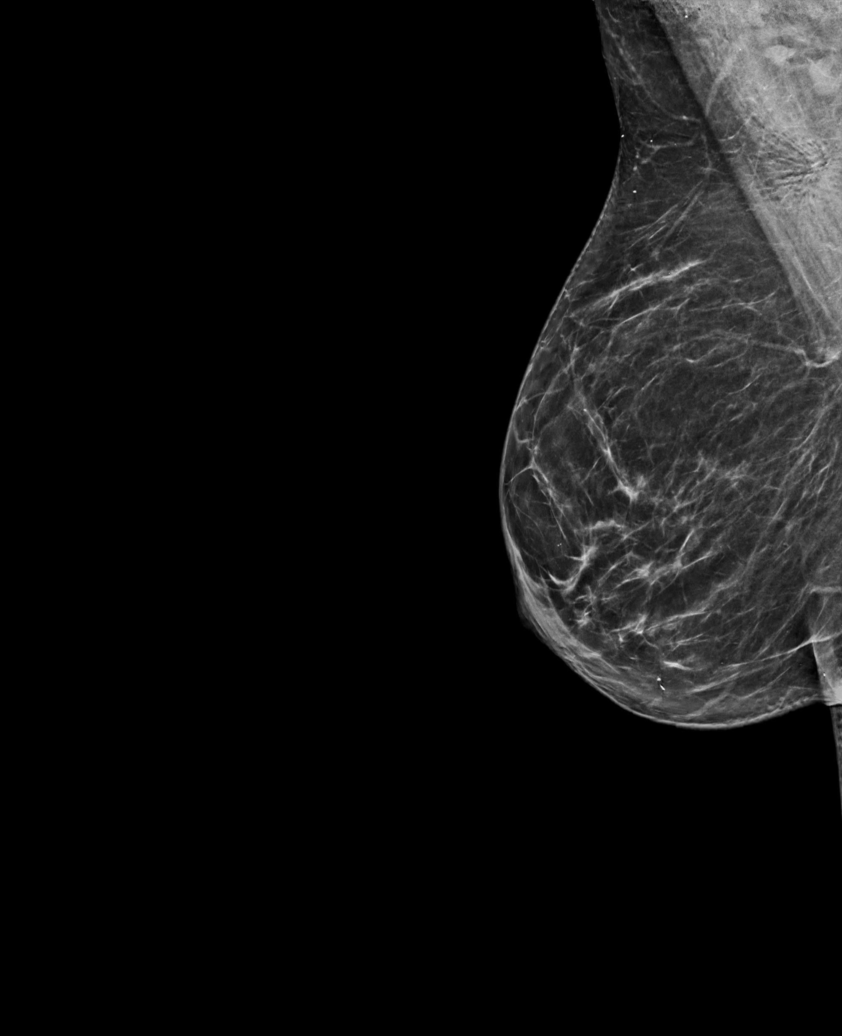

[L CC tomo · tomo slice 34/67.0]
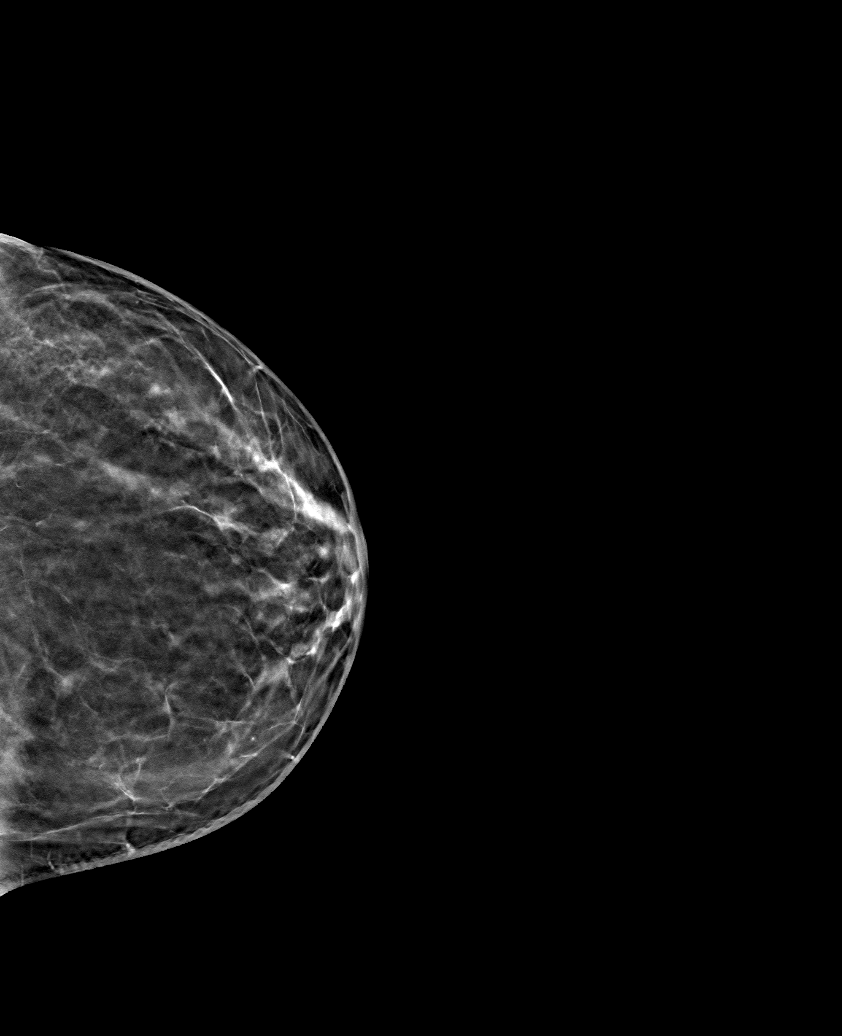

[6 of 30 positions shown; findings below may reference images not displayed]

ACR Breast Density Category b: There are scattered areas of
fibroglandular density.
FINDINGS: There are no findings suspicious for malignancy.
IMPRESSION: No mammographic evidence of malignancy. A result letter of this
screening mammogram will be mailed directly to the patient.

RECOMMENDATION:
Screening mammogram in one year. (Code:51-O-LD2)

BI-RADS CATEGORY  1: Negative.

## 2022-02-20 ENCOUNTER — Other Ambulatory Visit (HOSPITAL_BASED_OUTPATIENT_CLINIC_OR_DEPARTMENT_OTHER): Payer: Self-pay

## 2022-03-08 ENCOUNTER — Ambulatory Visit
Admission: RE | Admit: 2022-03-08 | Discharge: 2022-03-08 | Disposition: A | Discharge status: Home / Self Care | Payer: BLUE CROSS/BLUE SHIELD | Source: Ambulatory Visit | Attending: Internal Medicine | Admitting: Internal Medicine

## 2022-03-08 DIAGNOSIS — Z1231 Encounter for screening mammogram for malignant neoplasm of breast: Secondary | ICD-10-CM | POA: Diagnosis not present

## 2022-03-30 DIAGNOSIS — I10 Essential (primary) hypertension: Secondary | ICD-10-CM | POA: Diagnosis not present

## 2022-03-30 DIAGNOSIS — E119 Type 2 diabetes mellitus without complications: Secondary | ICD-10-CM | POA: Diagnosis not present

## 2022-04-06 ENCOUNTER — Other Ambulatory Visit (HOSPITAL_BASED_OUTPATIENT_CLINIC_OR_DEPARTMENT_OTHER): Payer: Self-pay

## 2022-04-08 ENCOUNTER — Other Ambulatory Visit (HOSPITAL_BASED_OUTPATIENT_CLINIC_OR_DEPARTMENT_OTHER): Payer: Self-pay

## 2022-04-08 MED ORDER — ATORVASTATIN CALCIUM 20 MG PO TABS
20.0000 mg | ORAL_TABLET | Freq: Every evening | ORAL | 0 refills | Status: DC
  Filled 2022-04-08: qty 90, 90d supply, fill #0

## 2022-04-09 ENCOUNTER — Other Ambulatory Visit (HOSPITAL_BASED_OUTPATIENT_CLINIC_OR_DEPARTMENT_OTHER): Payer: Self-pay

## 2022-04-16 ENCOUNTER — Other Ambulatory Visit: Payer: Self-pay

## 2022-04-16 ENCOUNTER — Other Ambulatory Visit (HOSPITAL_BASED_OUTPATIENT_CLINIC_OR_DEPARTMENT_OTHER): Payer: Self-pay

## 2022-04-16 DIAGNOSIS — E1165 Type 2 diabetes mellitus with hyperglycemia: Secondary | ICD-10-CM | POA: Diagnosis not present

## 2022-04-16 DIAGNOSIS — I10 Essential (primary) hypertension: Secondary | ICD-10-CM | POA: Diagnosis not present

## 2022-04-16 DIAGNOSIS — E7800 Pure hypercholesterolemia, unspecified: Secondary | ICD-10-CM | POA: Diagnosis not present

## 2022-04-16 DIAGNOSIS — E559 Vitamin D deficiency, unspecified: Secondary | ICD-10-CM | POA: Diagnosis not present

## 2022-04-16 MED ORDER — RYBELSUS 7 MG PO TABS
7.0000 mg | ORAL_TABLET | Freq: Every day | ORAL | 4 refills | Status: AC
  Filled 2022-04-16: qty 90, 90d supply, fill #0
  Filled 2022-07-10: qty 90, 90d supply, fill #1
  Filled 2022-10-08: qty 90, 90d supply, fill #2
  Filled 2023-01-02: qty 90, 90d supply, fill #3
  Filled 2023-04-03: qty 90, 90d supply, fill #4

## 2022-04-16 MED ORDER — GLIMEPIRIDE 1 MG PO TABS
1.0000 mg | ORAL_TABLET | Freq: Every day | ORAL | 5 refills | Status: DC
  Filled 2022-04-16: qty 30, 30d supply, fill #0
  Filled 2022-05-22 (×2): qty 30, 30d supply, fill #1
  Filled 2022-06-19: qty 30, 30d supply, fill #2
  Filled 2022-07-10 – 2022-07-14 (×2): qty 30, 30d supply, fill #3
  Filled 2022-08-14: qty 30, 30d supply, fill #4
  Filled 2022-09-12: qty 30, 30d supply, fill #5

## 2022-05-14 ENCOUNTER — Other Ambulatory Visit (HOSPITAL_BASED_OUTPATIENT_CLINIC_OR_DEPARTMENT_OTHER): Payer: Self-pay

## 2022-05-14 MED ORDER — LOSARTAN POTASSIUM 25 MG PO TABS
25.0000 mg | ORAL_TABLET | Freq: Every day | ORAL | 0 refills | Status: AC
  Filled 2022-05-14 – 2022-05-22 (×2): qty 30, 30d supply, fill #0

## 2022-05-18 ENCOUNTER — Other Ambulatory Visit (HOSPITAL_BASED_OUTPATIENT_CLINIC_OR_DEPARTMENT_OTHER): Payer: Self-pay

## 2022-05-18 MED ORDER — LOSARTAN POTASSIUM 25 MG PO TABS
25.0000 mg | ORAL_TABLET | Freq: Every day | ORAL | 0 refills | Status: DC
  Filled 2022-05-18 – 2022-06-19 (×2): qty 90, 90d supply, fill #0

## 2022-05-21 ENCOUNTER — Other Ambulatory Visit (HOSPITAL_BASED_OUTPATIENT_CLINIC_OR_DEPARTMENT_OTHER): Payer: Self-pay

## 2022-05-22 ENCOUNTER — Other Ambulatory Visit (HOSPITAL_BASED_OUTPATIENT_CLINIC_OR_DEPARTMENT_OTHER): Payer: Self-pay

## 2022-05-23 ENCOUNTER — Other Ambulatory Visit (HOSPITAL_BASED_OUTPATIENT_CLINIC_OR_DEPARTMENT_OTHER): Payer: Self-pay

## 2022-05-23 MED ORDER — ATORVASTATIN CALCIUM 20 MG PO TABS
ORAL_TABLET | ORAL | 0 refills | Status: AC
  Filled 2022-05-23 – 2022-06-19 (×3): qty 90, 90d supply, fill #0

## 2022-05-31 ENCOUNTER — Encounter (HOSPITAL_BASED_OUTPATIENT_CLINIC_OR_DEPARTMENT_OTHER): Payer: Self-pay | Admitting: Pharmacist

## 2022-05-31 ENCOUNTER — Other Ambulatory Visit (HOSPITAL_BASED_OUTPATIENT_CLINIC_OR_DEPARTMENT_OTHER): Payer: Self-pay

## 2022-06-01 ENCOUNTER — Other Ambulatory Visit: Payer: Self-pay

## 2022-06-19 ENCOUNTER — Other Ambulatory Visit (HOSPITAL_BASED_OUTPATIENT_CLINIC_OR_DEPARTMENT_OTHER): Payer: Self-pay

## 2022-06-20 DIAGNOSIS — Z1211 Encounter for screening for malignant neoplasm of colon: Secondary | ICD-10-CM | POA: Diagnosis not present

## 2022-06-20 DIAGNOSIS — E559 Vitamin D deficiency, unspecified: Secondary | ICD-10-CM | POA: Diagnosis not present

## 2022-06-20 DIAGNOSIS — E538 Deficiency of other specified B group vitamins: Secondary | ICD-10-CM | POA: Diagnosis not present

## 2022-06-20 DIAGNOSIS — E1151 Type 2 diabetes mellitus with diabetic peripheral angiopathy without gangrene: Secondary | ICD-10-CM | POA: Diagnosis not present

## 2022-06-20 DIAGNOSIS — I10 Essential (primary) hypertension: Secondary | ICD-10-CM | POA: Diagnosis not present

## 2022-06-20 DIAGNOSIS — E785 Hyperlipidemia, unspecified: Secondary | ICD-10-CM | POA: Diagnosis not present

## 2022-06-20 DIAGNOSIS — Z01411 Encounter for gynecological examination (general) (routine) with abnormal findings: Secondary | ICD-10-CM | POA: Diagnosis not present

## 2022-06-20 DIAGNOSIS — R053 Chronic cough: Secondary | ICD-10-CM | POA: Diagnosis not present

## 2022-06-28 DIAGNOSIS — Z130 Encounter for screening for diseases of the blood and blood-forming organs and certain disorders involving the immune mechanism: Secondary | ICD-10-CM | POA: Diagnosis not present

## 2022-06-28 DIAGNOSIS — Z6828 Body mass index (BMI) 28.0-28.9, adult: Secondary | ICD-10-CM | POA: Diagnosis not present

## 2022-06-28 DIAGNOSIS — Z1389 Encounter for screening for other disorder: Secondary | ICD-10-CM | POA: Diagnosis not present

## 2022-06-28 DIAGNOSIS — Z01419 Encounter for gynecological examination (general) (routine) without abnormal findings: Secondary | ICD-10-CM | POA: Diagnosis not present

## 2022-07-09 DIAGNOSIS — E119 Type 2 diabetes mellitus without complications: Secondary | ICD-10-CM | POA: Diagnosis not present

## 2022-07-10 ENCOUNTER — Other Ambulatory Visit (HOSPITAL_BASED_OUTPATIENT_CLINIC_OR_DEPARTMENT_OTHER): Payer: Self-pay

## 2022-07-11 ENCOUNTER — Other Ambulatory Visit (HOSPITAL_BASED_OUTPATIENT_CLINIC_OR_DEPARTMENT_OTHER): Payer: Self-pay

## 2022-07-11 ENCOUNTER — Other Ambulatory Visit: Payer: Self-pay

## 2022-07-11 MED ORDER — CLOBETASOL PROPIONATE 0.05 % EX OINT
TOPICAL_OINTMENT | CUTANEOUS | 2 refills | Status: AC
  Filled 2022-07-11: qty 30, 30d supply, fill #0
  Filled 2022-08-27: qty 30, 30d supply, fill #1

## 2022-07-31 ENCOUNTER — Other Ambulatory Visit (HOSPITAL_BASED_OUTPATIENT_CLINIC_OR_DEPARTMENT_OTHER): Payer: Self-pay

## 2022-08-06 ENCOUNTER — Other Ambulatory Visit (HOSPITAL_BASED_OUTPATIENT_CLINIC_OR_DEPARTMENT_OTHER): Payer: Self-pay

## 2022-08-15 ENCOUNTER — Other Ambulatory Visit (HOSPITAL_BASED_OUTPATIENT_CLINIC_OR_DEPARTMENT_OTHER): Payer: Self-pay

## 2022-08-22 DIAGNOSIS — I10 Essential (primary) hypertension: Secondary | ICD-10-CM | POA: Diagnosis not present

## 2022-08-22 DIAGNOSIS — E7800 Pure hypercholesterolemia, unspecified: Secondary | ICD-10-CM | POA: Diagnosis not present

## 2022-08-22 DIAGNOSIS — E1165 Type 2 diabetes mellitus with hyperglycemia: Secondary | ICD-10-CM | POA: Diagnosis not present

## 2022-08-22 DIAGNOSIS — E559 Vitamin D deficiency, unspecified: Secondary | ICD-10-CM | POA: Diagnosis not present

## 2022-08-27 ENCOUNTER — Other Ambulatory Visit (HOSPITAL_BASED_OUTPATIENT_CLINIC_OR_DEPARTMENT_OTHER): Payer: Self-pay

## 2022-08-27 ENCOUNTER — Other Ambulatory Visit: Payer: Self-pay

## 2022-08-27 MED ORDER — METFORMIN HCL ER 500 MG PO TB24
500.0000 mg | ORAL_TABLET | Freq: Two times a day (BID) | ORAL | 1 refills | Status: DC
  Filled 2022-08-27: qty 180, 90d supply, fill #0
  Filled 2022-12-01 (×2): qty 180, 90d supply, fill #1

## 2022-09-07 ENCOUNTER — Other Ambulatory Visit (HOSPITAL_BASED_OUTPATIENT_CLINIC_OR_DEPARTMENT_OTHER): Payer: Self-pay

## 2022-09-17 ENCOUNTER — Other Ambulatory Visit (HOSPITAL_BASED_OUTPATIENT_CLINIC_OR_DEPARTMENT_OTHER): Payer: Self-pay

## 2022-09-18 ENCOUNTER — Ambulatory Visit: Payer: BLUE CROSS/BLUE SHIELD | Admitting: Podiatry

## 2022-09-27 ENCOUNTER — Other Ambulatory Visit (HOSPITAL_BASED_OUTPATIENT_CLINIC_OR_DEPARTMENT_OTHER): Payer: Self-pay

## 2022-09-27 DIAGNOSIS — T7840XA Allergy, unspecified, initial encounter: Secondary | ICD-10-CM | POA: Diagnosis not present

## 2022-09-27 DIAGNOSIS — E1165 Type 2 diabetes mellitus with hyperglycemia: Secondary | ICD-10-CM | POA: Diagnosis not present

## 2022-09-27 DIAGNOSIS — Z91018 Allergy to other foods: Secondary | ICD-10-CM | POA: Diagnosis not present

## 2022-09-27 MED ORDER — PREDNISONE 20 MG PO TABS
20.0000 mg | ORAL_TABLET | Freq: Every day | ORAL | 0 refills | Status: DC
  Filled 2022-09-27: qty 5, 5d supply, fill #0

## 2022-09-27 MED ORDER — FAMOTIDINE 20 MG PO TABS
20.0000 mg | ORAL_TABLET | Freq: Two times a day (BID) | ORAL | 0 refills | Status: AC
  Filled 2022-09-27: qty 10, 5d supply, fill #0

## 2022-09-27 MED ORDER — CETIRIZINE HCL 10 MG PO TABS
10.0000 mg | ORAL_TABLET | Freq: Every day | ORAL | 0 refills | Status: AC
  Filled 2022-09-27: qty 7, 7d supply, fill #0

## 2022-09-29 ENCOUNTER — Other Ambulatory Visit (HOSPITAL_BASED_OUTPATIENT_CLINIC_OR_DEPARTMENT_OTHER): Payer: Self-pay

## 2022-10-01 ENCOUNTER — Other Ambulatory Visit (HOSPITAL_BASED_OUTPATIENT_CLINIC_OR_DEPARTMENT_OTHER): Payer: Self-pay

## 2022-10-01 MED ORDER — ATORVASTATIN CALCIUM 20 MG PO TABS
20.0000 mg | ORAL_TABLET | Freq: Every evening | ORAL | 1 refills | Status: DC
  Filled 2022-10-01: qty 90, 90d supply, fill #0
  Filled 2023-01-01: qty 90, 90d supply, fill #1

## 2022-10-01 MED ORDER — LOSARTAN POTASSIUM 25 MG PO TABS
25.0000 mg | ORAL_TABLET | Freq: Every day | ORAL | 1 refills | Status: DC
  Filled 2022-10-01: qty 90, 90d supply, fill #0
  Filled 2023-01-01: qty 90, 90d supply, fill #1

## 2022-10-04 DIAGNOSIS — Z91018 Allergy to other foods: Secondary | ICD-10-CM | POA: Diagnosis not present

## 2022-10-08 ENCOUNTER — Other Ambulatory Visit: Payer: Self-pay

## 2022-10-08 ENCOUNTER — Other Ambulatory Visit (HOSPITAL_BASED_OUTPATIENT_CLINIC_OR_DEPARTMENT_OTHER): Payer: Self-pay

## 2022-10-08 MED ORDER — GLIMEPIRIDE 1 MG PO TABS
1.0000 mg | ORAL_TABLET | Freq: Every day | ORAL | 5 refills | Status: AC
  Filled 2022-10-08: qty 30, 30d supply, fill #0
  Filled 2022-11-13: qty 30, 30d supply, fill #1
  Filled 2022-12-15 (×2): qty 30, 30d supply, fill #2
  Filled 2023-01-16: qty 30, 30d supply, fill #3

## 2022-10-18 ENCOUNTER — Other Ambulatory Visit: Payer: Self-pay | Admitting: Podiatry

## 2022-10-18 ENCOUNTER — Encounter: Payer: Self-pay | Admitting: Podiatry

## 2022-10-18 ENCOUNTER — Ambulatory Visit (INDEPENDENT_AMBULATORY_CARE_PROVIDER_SITE_OTHER): Payer: BLUE CROSS/BLUE SHIELD

## 2022-10-18 ENCOUNTER — Ambulatory Visit: Payer: BLUE CROSS/BLUE SHIELD | Admitting: Podiatry

## 2022-10-18 DIAGNOSIS — G5781 Other specified mononeuropathies of right lower limb: Secondary | ICD-10-CM

## 2022-10-18 DIAGNOSIS — M7751 Other enthesopathy of right foot: Secondary | ICD-10-CM

## 2022-10-18 DIAGNOSIS — M2040 Other hammer toe(s) (acquired), unspecified foot: Secondary | ICD-10-CM

## 2022-10-20 NOTE — Progress Notes (Signed)
She presents today stating that is not fasciitis this time it was bothering her her fourth and her fifth metatarsal phalangeal joints of the right foot are sore.  She says is feeling better today better than it has been recently.  Relates the pain is sharp and electrical.  Denies any trauma.  Objective: Vital signs are stable alert oriented x 3 there is no erythema edema cellulitis drainage or odor she does have pain on palpation to the third and fourth intermetatarsal spaces with palpable Mulder's click.  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  Assessment neuroma.  Plan: Offered her injection she declined.  Discussed appropriate shoe gear anti-inflammatories.

## 2022-11-13 ENCOUNTER — Other Ambulatory Visit: Payer: Self-pay

## 2022-11-13 ENCOUNTER — Other Ambulatory Visit (HOSPITAL_BASED_OUTPATIENT_CLINIC_OR_DEPARTMENT_OTHER): Payer: Self-pay

## 2022-11-20 ENCOUNTER — Other Ambulatory Visit (HOSPITAL_BASED_OUTPATIENT_CLINIC_OR_DEPARTMENT_OTHER): Payer: Self-pay

## 2022-11-20 MED ORDER — FLULAVAL 0.5 ML IM SUSY
0.5000 mL | PREFILLED_SYRINGE | Freq: Once | INTRAMUSCULAR | 0 refills | Status: AC
  Filled 2022-11-20: qty 0.5, 1d supply, fill #0

## 2022-12-01 ENCOUNTER — Other Ambulatory Visit (HOSPITAL_BASED_OUTPATIENT_CLINIC_OR_DEPARTMENT_OTHER): Payer: Self-pay

## 2022-12-15 ENCOUNTER — Other Ambulatory Visit (HOSPITAL_BASED_OUTPATIENT_CLINIC_OR_DEPARTMENT_OTHER): Payer: Self-pay

## 2022-12-17 ENCOUNTER — Other Ambulatory Visit (HOSPITAL_BASED_OUTPATIENT_CLINIC_OR_DEPARTMENT_OTHER): Payer: Self-pay

## 2022-12-17 ENCOUNTER — Other Ambulatory Visit: Payer: Self-pay

## 2022-12-22 ENCOUNTER — Other Ambulatory Visit (HOSPITAL_BASED_OUTPATIENT_CLINIC_OR_DEPARTMENT_OTHER): Payer: Self-pay

## 2023-01-01 ENCOUNTER — Other Ambulatory Visit (HOSPITAL_BASED_OUTPATIENT_CLINIC_OR_DEPARTMENT_OTHER): Payer: Self-pay

## 2023-01-02 ENCOUNTER — Other Ambulatory Visit (HOSPITAL_BASED_OUTPATIENT_CLINIC_OR_DEPARTMENT_OTHER): Payer: Self-pay

## 2023-01-02 ENCOUNTER — Other Ambulatory Visit: Payer: Self-pay

## 2023-01-04 ENCOUNTER — Other Ambulatory Visit (HOSPITAL_BASED_OUTPATIENT_CLINIC_OR_DEPARTMENT_OTHER): Payer: Self-pay

## 2023-01-04 MED ORDER — COMIRNATY 30 MCG/0.3ML IM SUSY
0.3000 mL | PREFILLED_SYRINGE | Freq: Once | INTRAMUSCULAR | 0 refills | Status: AC
  Filled 2023-01-04: qty 0.3, 1d supply, fill #0

## 2023-01-28 ENCOUNTER — Other Ambulatory Visit (HOSPITAL_BASED_OUTPATIENT_CLINIC_OR_DEPARTMENT_OTHER): Payer: Self-pay

## 2023-01-31 DIAGNOSIS — E1165 Type 2 diabetes mellitus with hyperglycemia: Secondary | ICD-10-CM | POA: Diagnosis not present

## 2023-01-31 DIAGNOSIS — I10 Essential (primary) hypertension: Secondary | ICD-10-CM | POA: Diagnosis not present

## 2023-02-07 ENCOUNTER — Other Ambulatory Visit (HOSPITAL_BASED_OUTPATIENT_CLINIC_OR_DEPARTMENT_OTHER): Payer: Self-pay

## 2023-02-07 DIAGNOSIS — E7800 Pure hypercholesterolemia, unspecified: Secondary | ICD-10-CM | POA: Diagnosis not present

## 2023-02-07 DIAGNOSIS — E1165 Type 2 diabetes mellitus with hyperglycemia: Secondary | ICD-10-CM | POA: Diagnosis not present

## 2023-02-07 DIAGNOSIS — I10 Essential (primary) hypertension: Secondary | ICD-10-CM | POA: Diagnosis not present

## 2023-02-07 DIAGNOSIS — E559 Vitamin D deficiency, unspecified: Secondary | ICD-10-CM | POA: Diagnosis not present

## 2023-02-07 MED ORDER — GLIMEPIRIDE 1 MG PO TABS
1.0000 mg | ORAL_TABLET | Freq: Every day | ORAL | 5 refills | Status: DC
  Filled 2023-02-07 – 2023-02-11 (×3): qty 90, 90d supply, fill #0
  Filled 2023-05-01: qty 90, 90d supply, fill #1

## 2023-02-08 ENCOUNTER — Other Ambulatory Visit (HOSPITAL_BASED_OUTPATIENT_CLINIC_OR_DEPARTMENT_OTHER): Payer: Self-pay

## 2023-02-11 ENCOUNTER — Other Ambulatory Visit: Payer: Self-pay

## 2023-02-11 ENCOUNTER — Other Ambulatory Visit (HOSPITAL_BASED_OUTPATIENT_CLINIC_OR_DEPARTMENT_OTHER): Payer: Self-pay

## 2023-02-12 ENCOUNTER — Other Ambulatory Visit (HOSPITAL_BASED_OUTPATIENT_CLINIC_OR_DEPARTMENT_OTHER): Payer: Self-pay

## 2023-03-21 ENCOUNTER — Other Ambulatory Visit (HOSPITAL_BASED_OUTPATIENT_CLINIC_OR_DEPARTMENT_OTHER): Payer: Self-pay

## 2023-03-21 MED ORDER — METFORMIN HCL ER 500 MG PO TB24
500.0000 mg | ORAL_TABLET | Freq: Two times a day (BID) | ORAL | 1 refills | Status: DC
  Filled 2023-03-30: qty 180, 90d supply, fill #0
  Filled 2023-08-20: qty 180, 90d supply, fill #1

## 2023-03-22 ENCOUNTER — Other Ambulatory Visit (HOSPITAL_BASED_OUTPATIENT_CLINIC_OR_DEPARTMENT_OTHER): Payer: Self-pay

## 2023-03-22 DIAGNOSIS — J208 Acute bronchitis due to other specified organisms: Secondary | ICD-10-CM | POA: Diagnosis not present

## 2023-03-22 DIAGNOSIS — J019 Acute sinusitis, unspecified: Secondary | ICD-10-CM | POA: Diagnosis not present

## 2023-03-22 DIAGNOSIS — B9689 Other specified bacterial agents as the cause of diseases classified elsewhere: Secondary | ICD-10-CM | POA: Diagnosis not present

## 2023-03-22 DIAGNOSIS — Z9109 Other allergy status, other than to drugs and biological substances: Secondary | ICD-10-CM | POA: Diagnosis not present

## 2023-03-22 MED ORDER — CETIRIZINE HCL 10 MG PO TABS
10.0000 mg | ORAL_TABLET | Freq: Every day | ORAL | 0 refills | Status: AC
  Filled 2023-03-22 – 2023-04-03 (×2): qty 30, 30d supply, fill #0

## 2023-03-22 MED ORDER — AMOXICILLIN-POT CLAVULANATE 875-125 MG PO TABS
1.0000 | ORAL_TABLET | Freq: Two times a day (BID) | ORAL | 0 refills | Status: AC
  Filled 2023-03-22: qty 14, 7d supply, fill #0

## 2023-03-22 MED ORDER — ALBUTEROL SULFATE HFA 108 (90 BASE) MCG/ACT IN AERS
2.0000 | INHALATION_SPRAY | RESPIRATORY_TRACT | 3 refills | Status: AC | PRN
  Filled 2023-03-22: qty 6.7, 28d supply, fill #0

## 2023-03-22 MED ORDER — FLUTICASONE PROPIONATE 50 MCG/ACT NA SUSP
2.0000 | Freq: Every day | NASAL | 3 refills | Status: AC
  Filled 2023-03-22: qty 16, 30d supply, fill #0
  Filled 2023-05-13: qty 16, 30d supply, fill #1
  Filled 2023-12-23: qty 16, 30d supply, fill #2

## 2023-03-28 ENCOUNTER — Encounter (HOSPITAL_BASED_OUTPATIENT_CLINIC_OR_DEPARTMENT_OTHER): Payer: Self-pay | Admitting: Radiology

## 2023-03-28 ENCOUNTER — Ambulatory Visit (HOSPITAL_BASED_OUTPATIENT_CLINIC_OR_DEPARTMENT_OTHER)
Admission: RE | Admit: 2023-03-28 | Discharge: 2023-03-28 | Disposition: A | Discharge status: Home / Self Care | Payer: BLUE CROSS/BLUE SHIELD | Source: Ambulatory Visit

## 2023-03-28 DIAGNOSIS — Z1231 Encounter for screening mammogram for malignant neoplasm of breast: Secondary | ICD-10-CM | POA: Insufficient documentation

## 2023-03-30 ENCOUNTER — Other Ambulatory Visit (HOSPITAL_BASED_OUTPATIENT_CLINIC_OR_DEPARTMENT_OTHER): Payer: Self-pay

## 2023-04-01 ENCOUNTER — Other Ambulatory Visit: Payer: Self-pay

## 2023-04-03 ENCOUNTER — Other Ambulatory Visit: Payer: Self-pay

## 2023-04-03 ENCOUNTER — Other Ambulatory Visit (HOSPITAL_BASED_OUTPATIENT_CLINIC_OR_DEPARTMENT_OTHER): Payer: Self-pay

## 2023-04-03 MED ORDER — LOSARTAN POTASSIUM 25 MG PO TABS
25.0000 mg | ORAL_TABLET | Freq: Every day | ORAL | 0 refills | Status: DC
  Filled 2023-04-05: qty 90, 90d supply, fill #0

## 2023-04-03 MED ORDER — ATORVASTATIN CALCIUM 20 MG PO TABS
20.0000 mg | ORAL_TABLET | Freq: Every evening | ORAL | 0 refills | Status: DC
  Filled 2023-04-05: qty 90, 90d supply, fill #0

## 2023-04-05 ENCOUNTER — Other Ambulatory Visit (HOSPITAL_BASED_OUTPATIENT_CLINIC_OR_DEPARTMENT_OTHER): Payer: Self-pay

## 2023-05-01 ENCOUNTER — Other Ambulatory Visit (HOSPITAL_BASED_OUTPATIENT_CLINIC_OR_DEPARTMENT_OTHER): Payer: Self-pay

## 2023-05-03 ENCOUNTER — Other Ambulatory Visit (HOSPITAL_BASED_OUTPATIENT_CLINIC_OR_DEPARTMENT_OTHER): Payer: Self-pay

## 2023-05-07 DIAGNOSIS — J101 Influenza due to other identified influenza virus with other respiratory manifestations: Secondary | ICD-10-CM | POA: Diagnosis not present

## 2023-05-08 ENCOUNTER — Other Ambulatory Visit (HOSPITAL_BASED_OUTPATIENT_CLINIC_OR_DEPARTMENT_OTHER): Payer: Self-pay

## 2023-05-08 MED ORDER — OSELTAMIVIR PHOSPHATE 75 MG PO CAPS
75.0000 mg | ORAL_CAPSULE | Freq: Two times a day (BID) | ORAL | 0 refills | Status: DC
  Filled 2023-05-08: qty 10, 5d supply, fill #0

## 2023-05-13 ENCOUNTER — Other Ambulatory Visit (HOSPITAL_BASED_OUTPATIENT_CLINIC_OR_DEPARTMENT_OTHER): Payer: Self-pay

## 2023-05-13 DIAGNOSIS — I10 Essential (primary) hypertension: Secondary | ICD-10-CM | POA: Diagnosis not present

## 2023-05-13 DIAGNOSIS — E1165 Type 2 diabetes mellitus with hyperglycemia: Secondary | ICD-10-CM | POA: Diagnosis not present

## 2023-05-13 DIAGNOSIS — R309 Painful micturition, unspecified: Secondary | ICD-10-CM | POA: Diagnosis not present

## 2023-05-13 DIAGNOSIS — J159 Unspecified bacterial pneumonia: Secondary | ICD-10-CM | POA: Diagnosis not present

## 2023-05-13 MED ORDER — AMOXICILLIN-POT CLAVULANATE 875-125 MG PO TABS
1.0000 | ORAL_TABLET | Freq: Two times a day (BID) | ORAL | 0 refills | Status: DC
  Filled 2023-05-13: qty 10, 5d supply, fill #0

## 2023-05-14 ENCOUNTER — Ambulatory Visit
Admission: RE | Admit: 2023-05-14 | Discharge: 2023-05-14 | Disposition: A | Discharge status: Home / Self Care | Payer: BLUE CROSS/BLUE SHIELD | Source: Ambulatory Visit | Attending: Internal Medicine | Admitting: Internal Medicine

## 2023-05-14 ENCOUNTER — Other Ambulatory Visit: Payer: Self-pay | Admitting: Internal Medicine

## 2023-05-14 DIAGNOSIS — J159 Unspecified bacterial pneumonia: Secondary | ICD-10-CM

## 2023-06-04 DIAGNOSIS — E7800 Pure hypercholesterolemia, unspecified: Secondary | ICD-10-CM | POA: Diagnosis not present

## 2023-06-04 DIAGNOSIS — R197 Diarrhea, unspecified: Secondary | ICD-10-CM | POA: Diagnosis not present

## 2023-06-04 DIAGNOSIS — E559 Vitamin D deficiency, unspecified: Secondary | ICD-10-CM | POA: Diagnosis not present

## 2023-06-04 DIAGNOSIS — E1165 Type 2 diabetes mellitus with hyperglycemia: Secondary | ICD-10-CM | POA: Diagnosis not present

## 2023-06-07 ENCOUNTER — Telehealth: Payer: Self-pay | Admitting: Pulmonary Disease

## 2023-06-07 NOTE — Telephone Encounter (Signed)
 LM for PT to call for Acute appt w/any provider.

## 2023-06-07 NOTE — Telephone Encounter (Signed)
 Please offer her appt with any provider , next available  Sub-acute cough

## 2023-06-11 NOTE — Telephone Encounter (Signed)
 LM and sent [facility] msg

## 2023-06-17 ENCOUNTER — Other Ambulatory Visit (HOSPITAL_BASED_OUTPATIENT_CLINIC_OR_DEPARTMENT_OTHER): Payer: Self-pay

## 2023-06-17 MED ORDER — SULFAMETHOXAZOLE-TRIMETHOPRIM 800-160 MG PO TABS
1.0000 | ORAL_TABLET | Freq: Two times a day (BID) | ORAL | 1 refills | Status: AC
  Filled 2023-06-17: qty 60, 30d supply, fill #0

## 2023-06-18 ENCOUNTER — Other Ambulatory Visit (HOSPITAL_BASED_OUTPATIENT_CLINIC_OR_DEPARTMENT_OTHER): Payer: Self-pay

## 2023-06-18 DIAGNOSIS — I10 Essential (primary) hypertension: Secondary | ICD-10-CM | POA: Diagnosis not present

## 2023-06-18 DIAGNOSIS — E1165 Type 2 diabetes mellitus with hyperglycemia: Secondary | ICD-10-CM | POA: Diagnosis not present

## 2023-06-18 DIAGNOSIS — M5441 Lumbago with sciatica, right side: Secondary | ICD-10-CM | POA: Diagnosis not present

## 2023-06-18 MED ORDER — METHOCARBAMOL 500 MG PO TABS
500.0000 mg | ORAL_TABLET | Freq: Two times a day (BID) | ORAL | 0 refills | Status: AC
  Filled 2023-06-18: qty 14, 7d supply, fill #0

## 2023-06-18 MED ORDER — IBUPROFEN 600 MG PO TABS
600.0000 mg | ORAL_TABLET | Freq: Three times a day (TID) | ORAL | 1 refills | Status: AC
  Filled 2023-06-18: qty 21, 7d supply, fill #0
  Filled 2023-07-09: qty 21, 7d supply, fill #1

## 2023-06-18 MED ORDER — METHYLPREDNISOLONE 4 MG PO TBPK
ORAL_TABLET | ORAL | 0 refills | Status: DC
  Filled 2023-06-18: qty 21, 6d supply, fill #0

## 2023-06-19 ENCOUNTER — Other Ambulatory Visit (HOSPITAL_BASED_OUTPATIENT_CLINIC_OR_DEPARTMENT_OTHER): Payer: Self-pay

## 2023-06-19 ENCOUNTER — Other Ambulatory Visit: Payer: Self-pay

## 2023-06-19 DIAGNOSIS — I10 Essential (primary) hypertension: Secondary | ICD-10-CM | POA: Diagnosis not present

## 2023-06-19 DIAGNOSIS — E1165 Type 2 diabetes mellitus with hyperglycemia: Secondary | ICD-10-CM | POA: Diagnosis not present

## 2023-06-19 DIAGNOSIS — E7800 Pure hypercholesterolemia, unspecified: Secondary | ICD-10-CM | POA: Diagnosis not present

## 2023-06-19 DIAGNOSIS — E559 Vitamin D deficiency, unspecified: Secondary | ICD-10-CM | POA: Diagnosis not present

## 2023-06-19 MED ORDER — ATORVASTATIN CALCIUM 10 MG PO TABS
10.0000 mg | ORAL_TABLET | Freq: Every day | ORAL | 5 refills | Status: DC
  Filled 2023-06-19: qty 90, 90d supply, fill #0

## 2023-06-19 MED ORDER — MOUNJARO 2.5 MG/0.5ML ~~LOC~~ SOAJ
2.5000 mg | SUBCUTANEOUS | 0 refills | Status: DC
  Filled 2023-06-19: qty 2, 28d supply, fill #0

## 2023-06-19 MED ORDER — LOSARTAN POTASSIUM 25 MG PO TABS
25.0000 mg | ORAL_TABLET | Freq: Every day | ORAL | 5 refills | Status: AC
  Filled 2023-06-19: qty 90, 90d supply, fill #0
  Filled 2023-10-29 – 2023-12-21 (×6): qty 90, 90d supply, fill #1

## 2023-06-19 MED ORDER — VITAMIN D (ERGOCALCIFEROL) 1.25 MG (50000 UNIT) PO CAPS
50000.0000 [IU] | ORAL_CAPSULE | ORAL | 5 refills | Status: AC
  Filled 2023-06-19: qty 4, 28d supply, fill #0
  Filled 2023-10-30 – 2023-11-13 (×3): qty 4, 28d supply, fill #1
  Filled 2023-12-09 – 2023-12-21 (×2): qty 4, 28d supply, fill #2
  Filled 2024-01-21: qty 4, 28d supply, fill #3

## 2023-06-19 MED ORDER — LOSARTAN POTASSIUM 25 MG PO TABS
25.0000 mg | ORAL_TABLET | Freq: Every day | ORAL | 5 refills | Status: AC
  Filled 2023-06-19: qty 90, 90d supply, fill #0

## 2023-06-19 MED ORDER — ATORVASTATIN CALCIUM 10 MG PO TABS
10.0000 mg | ORAL_TABLET | Freq: Every day | ORAL | 5 refills | Status: AC
  Filled 2023-06-19: qty 90, 90d supply, fill #0

## 2023-06-29 ENCOUNTER — Other Ambulatory Visit (HOSPITAL_BASED_OUTPATIENT_CLINIC_OR_DEPARTMENT_OTHER): Payer: Self-pay

## 2023-06-29 MED ORDER — MOUNJARO 2.5 MG/0.5ML ~~LOC~~ SOAJ
SUBCUTANEOUS | 6 refills | Status: AC
Start: 2023-06-29 — End: ?
  Filled 2023-06-29 – 2023-07-04 (×2): qty 2, 28d supply, fill #0

## 2023-07-01 ENCOUNTER — Other Ambulatory Visit (HOSPITAL_BASED_OUTPATIENT_CLINIC_OR_DEPARTMENT_OTHER): Payer: Self-pay

## 2023-07-01 MED ORDER — ATORVASTATIN CALCIUM 20 MG PO TABS
20.0000 mg | ORAL_TABLET | Freq: Every evening | ORAL | 0 refills | Status: DC
  Filled 2023-07-01: qty 90, 90d supply, fill #0

## 2023-07-01 MED ORDER — LOSARTAN POTASSIUM 25 MG PO TABS
25.0000 mg | ORAL_TABLET | Freq: Every day | ORAL | 0 refills | Status: DC
  Filled 2023-07-01 – 2023-09-11 (×3): qty 90, 90d supply, fill #0

## 2023-07-03 DIAGNOSIS — Z01419 Encounter for gynecological examination (general) (routine) without abnormal findings: Secondary | ICD-10-CM | POA: Diagnosis not present

## 2023-07-05 ENCOUNTER — Encounter (HOSPITAL_BASED_OUTPATIENT_CLINIC_OR_DEPARTMENT_OTHER): Payer: Self-pay

## 2023-07-05 ENCOUNTER — Other Ambulatory Visit (HOSPITAL_BASED_OUTPATIENT_CLINIC_OR_DEPARTMENT_OTHER): Payer: Self-pay

## 2023-07-09 ENCOUNTER — Other Ambulatory Visit: Payer: Self-pay

## 2023-07-09 ENCOUNTER — Other Ambulatory Visit (HOSPITAL_BASED_OUTPATIENT_CLINIC_OR_DEPARTMENT_OTHER): Payer: Self-pay

## 2023-07-09 DIAGNOSIS — W19XXXA Unspecified fall, initial encounter: Secondary | ICD-10-CM | POA: Diagnosis not present

## 2023-07-09 DIAGNOSIS — M5441 Lumbago with sciatica, right side: Secondary | ICD-10-CM | POA: Diagnosis not present

## 2023-07-09 DIAGNOSIS — T148XXA Other injury of unspecified body region, initial encounter: Secondary | ICD-10-CM | POA: Diagnosis not present

## 2023-07-10 ENCOUNTER — Other Ambulatory Visit (HOSPITAL_BASED_OUTPATIENT_CLINIC_OR_DEPARTMENT_OTHER): Payer: Self-pay

## 2023-07-10 ENCOUNTER — Encounter (HOSPITAL_BASED_OUTPATIENT_CLINIC_OR_DEPARTMENT_OTHER): Payer: Self-pay

## 2023-07-15 ENCOUNTER — Other Ambulatory Visit (HOSPITAL_BASED_OUTPATIENT_CLINIC_OR_DEPARTMENT_OTHER): Payer: Self-pay

## 2023-07-15 MED ORDER — MOUNJARO 5 MG/0.5ML ~~LOC~~ SOAJ
5.0000 mg | SUBCUTANEOUS | 5 refills | Status: DC
  Filled 2023-07-15: qty 2, 28d supply, fill #0
  Filled 2023-08-06: qty 2, 28d supply, fill #1
  Filled 2023-09-03: qty 2, 28d supply, fill #2

## 2023-07-19 DIAGNOSIS — T148XXA Other injury of unspecified body region, initial encounter: Secondary | ICD-10-CM | POA: Diagnosis not present

## 2023-07-19 DIAGNOSIS — M5441 Lumbago with sciatica, right side: Secondary | ICD-10-CM | POA: Diagnosis not present

## 2023-07-19 DIAGNOSIS — W19XXXA Unspecified fall, initial encounter: Secondary | ICD-10-CM | POA: Diagnosis not present

## 2023-07-22 ENCOUNTER — Other Ambulatory Visit: Payer: Self-pay | Admitting: Family Medicine

## 2023-07-22 ENCOUNTER — Ambulatory Visit
Admission: RE | Admit: 2023-07-22 | Discharge: 2023-07-22 | Disposition: A | Discharge status: Home / Self Care | Source: Ambulatory Visit | Attending: Family Medicine | Admitting: Family Medicine

## 2023-07-22 DIAGNOSIS — M79662 Pain in left lower leg: Secondary | ICD-10-CM

## 2023-07-22 DIAGNOSIS — M79605 Pain in left leg: Secondary | ICD-10-CM | POA: Diagnosis not present

## 2023-07-22 DIAGNOSIS — R600 Localized edema: Secondary | ICD-10-CM | POA: Diagnosis not present

## 2023-07-22 DIAGNOSIS — M79604 Pain in right leg: Secondary | ICD-10-CM | POA: Diagnosis not present

## 2023-07-30 ENCOUNTER — Ambulatory Visit: Admitting: Pulmonary Disease

## 2023-08-07 DIAGNOSIS — E119 Type 2 diabetes mellitus without complications: Secondary | ICD-10-CM | POA: Diagnosis not present

## 2023-08-20 ENCOUNTER — Other Ambulatory Visit (HOSPITAL_BASED_OUTPATIENT_CLINIC_OR_DEPARTMENT_OTHER): Payer: Self-pay

## 2023-09-03 ENCOUNTER — Other Ambulatory Visit (HOSPITAL_BASED_OUTPATIENT_CLINIC_OR_DEPARTMENT_OTHER): Payer: Self-pay

## 2023-09-03 ENCOUNTER — Other Ambulatory Visit: Payer: Self-pay

## 2023-09-12 ENCOUNTER — Other Ambulatory Visit (HOSPITAL_BASED_OUTPATIENT_CLINIC_OR_DEPARTMENT_OTHER): Payer: Self-pay

## 2023-09-16 ENCOUNTER — Other Ambulatory Visit (HOSPITAL_BASED_OUTPATIENT_CLINIC_OR_DEPARTMENT_OTHER): Payer: Self-pay

## 2023-09-16 MED ORDER — MOUNJARO 7.5 MG/0.5ML ~~LOC~~ SOAJ
7.5000 mg | SUBCUTANEOUS | 5 refills | Status: DC
  Filled 2023-09-26: qty 2, 28d supply, fill #0
  Filled 2023-10-29: qty 2, 28d supply, fill #1
  Filled 2023-11-23: qty 2, 28d supply, fill #2
  Filled 2023-12-21: qty 2, 28d supply, fill #3
  Filled 2024-01-21: qty 4, 56d supply, fill #4

## 2023-09-26 ENCOUNTER — Other Ambulatory Visit (HOSPITAL_BASED_OUTPATIENT_CLINIC_OR_DEPARTMENT_OTHER): Payer: Self-pay

## 2023-09-26 ENCOUNTER — Other Ambulatory Visit: Payer: Self-pay

## 2023-09-27 ENCOUNTER — Other Ambulatory Visit (HOSPITAL_BASED_OUTPATIENT_CLINIC_OR_DEPARTMENT_OTHER): Payer: Self-pay

## 2023-09-27 MED ORDER — ATORVASTATIN CALCIUM 20 MG PO TABS
20.0000 mg | ORAL_TABLET | Freq: Every evening | ORAL | 0 refills | Status: AC
  Filled 2023-09-27 (×2): qty 90, 90d supply, fill #0

## 2023-09-27 MED ORDER — ATORVASTATIN CALCIUM 20 MG PO TABS
20.0000 mg | ORAL_TABLET | Freq: Every evening | ORAL | 0 refills | Status: DC
  Filled 2024-01-21: qty 90, 90d supply, fill #0

## 2023-09-30 ENCOUNTER — Other Ambulatory Visit: Payer: Self-pay

## 2023-10-29 ENCOUNTER — Other Ambulatory Visit (HOSPITAL_BASED_OUTPATIENT_CLINIC_OR_DEPARTMENT_OTHER): Payer: Self-pay

## 2023-10-31 ENCOUNTER — Other Ambulatory Visit (HOSPITAL_BASED_OUTPATIENT_CLINIC_OR_DEPARTMENT_OTHER): Payer: Self-pay

## 2023-10-31 ENCOUNTER — Other Ambulatory Visit: Payer: Self-pay

## 2023-11-11 ENCOUNTER — Other Ambulatory Visit (HOSPITAL_BASED_OUTPATIENT_CLINIC_OR_DEPARTMENT_OTHER): Payer: Self-pay

## 2023-11-13 ENCOUNTER — Other Ambulatory Visit (HOSPITAL_BASED_OUTPATIENT_CLINIC_OR_DEPARTMENT_OTHER): Payer: Self-pay

## 2023-11-20 DIAGNOSIS — E1165 Type 2 diabetes mellitus with hyperglycemia: Secondary | ICD-10-CM | POA: Diagnosis not present

## 2023-11-20 DIAGNOSIS — E559 Vitamin D deficiency, unspecified: Secondary | ICD-10-CM | POA: Diagnosis not present

## 2023-11-20 DIAGNOSIS — E7800 Pure hypercholesterolemia, unspecified: Secondary | ICD-10-CM | POA: Diagnosis not present

## 2023-11-23 ENCOUNTER — Other Ambulatory Visit (HOSPITAL_BASED_OUTPATIENT_CLINIC_OR_DEPARTMENT_OTHER): Payer: Self-pay

## 2023-11-25 ENCOUNTER — Other Ambulatory Visit: Payer: Self-pay

## 2023-11-25 ENCOUNTER — Other Ambulatory Visit (HOSPITAL_BASED_OUTPATIENT_CLINIC_OR_DEPARTMENT_OTHER): Payer: Self-pay

## 2023-11-25 MED ORDER — FLUZONE 0.5 ML IM SUSY
0.5000 mL | PREFILLED_SYRINGE | Freq: Once | INTRAMUSCULAR | 0 refills | Status: AC
Start: 2023-11-25 — End: 2023-11-26
  Filled 2023-11-25: qty 0.5, 1d supply, fill #0

## 2023-11-25 MED ORDER — METFORMIN HCL ER 500 MG PO TB24
500.0000 mg | ORAL_TABLET | Freq: Two times a day (BID) | ORAL | 1 refills | Status: DC
  Filled 2023-11-25: qty 180, 90d supply, fill #0
  Filled 2024-01-21: qty 180, 90d supply, fill #1

## 2023-11-27 DIAGNOSIS — E1165 Type 2 diabetes mellitus with hyperglycemia: Secondary | ICD-10-CM | POA: Diagnosis not present

## 2023-11-27 DIAGNOSIS — E7800 Pure hypercholesterolemia, unspecified: Secondary | ICD-10-CM | POA: Diagnosis not present

## 2023-11-27 DIAGNOSIS — Z23 Encounter for immunization: Secondary | ICD-10-CM | POA: Diagnosis not present

## 2023-11-27 DIAGNOSIS — I10 Essential (primary) hypertension: Secondary | ICD-10-CM | POA: Diagnosis not present

## 2023-11-27 DIAGNOSIS — E559 Vitamin D deficiency, unspecified: Secondary | ICD-10-CM | POA: Diagnosis not present

## 2023-12-19 ENCOUNTER — Other Ambulatory Visit (HOSPITAL_BASED_OUTPATIENT_CLINIC_OR_DEPARTMENT_OTHER): Payer: Self-pay

## 2024-01-21 ENCOUNTER — Other Ambulatory Visit (HOSPITAL_BASED_OUTPATIENT_CLINIC_OR_DEPARTMENT_OTHER): Payer: Self-pay

## 2024-01-22 ENCOUNTER — Other Ambulatory Visit (HOSPITAL_BASED_OUTPATIENT_CLINIC_OR_DEPARTMENT_OTHER): Payer: Self-pay

## 2024-03-02 NOTE — Progress Notes (Unsigned)
 Alison Sparks                                          MRN: [id number]   03/02/2024   The [facility] Quality Team Specialist reviewed this patient medical record for the purposes of chart review for care gap closure. The following were reviewed: chart review for care gap closure-{CHL AMB [facility] QUALITY SPECIALIST CARE HJED:[id number]}.    [facility] Quality Team

## 2024-03-09 ENCOUNTER — Other Ambulatory Visit (HOSPITAL_BASED_OUTPATIENT_CLINIC_OR_DEPARTMENT_OTHER): Payer: Self-pay

## 2024-03-09 MED ORDER — MOUNJARO 7.5 MG/0.5ML ~~LOC~~ SOAJ
7.5000 mg | SUBCUTANEOUS | 5 refills | Status: AC
  Filled 2024-03-09: qty 2, 28d supply, fill #0
  Filled 2024-04-10: qty 6, 84d supply, fill #1

## 2024-03-10 ENCOUNTER — Other Ambulatory Visit (HOSPITAL_BASED_OUTPATIENT_CLINIC_OR_DEPARTMENT_OTHER): Payer: Self-pay

## 2024-03-10 DIAGNOSIS — R300 Dysuria: Secondary | ICD-10-CM | POA: Diagnosis not present

## 2024-03-10 DIAGNOSIS — E1165 Type 2 diabetes mellitus with hyperglycemia: Secondary | ICD-10-CM | POA: Diagnosis not present

## 2024-03-10 DIAGNOSIS — N3000 Acute cystitis without hematuria: Secondary | ICD-10-CM | POA: Diagnosis not present

## 2024-03-10 DIAGNOSIS — M542 Cervicalgia: Secondary | ICD-10-CM | POA: Diagnosis not present

## 2024-03-10 MED ORDER — LOSARTAN POTASSIUM 25 MG PO TABS
25.0000 mg | ORAL_TABLET | Freq: Every day | ORAL | 0 refills | Status: AC
  Filled 2024-03-10: qty 90, 90d supply, fill #0

## 2024-03-10 MED ORDER — MELOXICAM 15 MG PO TABS
15.0000 mg | ORAL_TABLET | Freq: Every day | ORAL | 0 refills | Status: AC | PRN
  Filled 2024-03-10: qty 15, 15d supply, fill #0

## 2024-03-10 MED ORDER — CEPHALEXIN 500 MG PO CAPS
500.0000 mg | ORAL_CAPSULE | Freq: Four times a day (QID) | ORAL | 0 refills | Status: DC
  Filled 2024-03-10: qty 20, 5d supply, fill #0

## 2024-03-15 ENCOUNTER — Other Ambulatory Visit (HOSPITAL_BASED_OUTPATIENT_CLINIC_OR_DEPARTMENT_OTHER): Payer: Self-pay

## 2024-03-15 MED ORDER — AMOXICILLIN-POT CLAVULANATE 875-125 MG PO TABS
1.0000 | ORAL_TABLET | Freq: Two times a day (BID) | ORAL | 0 refills | Status: AC
  Filled 2024-03-15: qty 10, 5d supply, fill #0

## 2024-03-16 ENCOUNTER — Other Ambulatory Visit (HOSPITAL_BASED_OUTPATIENT_CLINIC_OR_DEPARTMENT_OTHER): Payer: Self-pay

## 2024-04-09 ENCOUNTER — Other Ambulatory Visit (HOSPITAL_BASED_OUTPATIENT_CLINIC_OR_DEPARTMENT_OTHER): Payer: Self-pay

## 2024-04-10 ENCOUNTER — Other Ambulatory Visit (HOSPITAL_BASED_OUTPATIENT_CLINIC_OR_DEPARTMENT_OTHER): Payer: Self-pay

## 2024-04-10 MED ORDER — ATORVASTATIN CALCIUM 20 MG PO TABS
20.0000 mg | ORAL_TABLET | Freq: Every evening | ORAL | 0 refills | Status: AC
  Filled 2024-04-10: qty 90, 90d supply, fill #0

## 2024-04-16 ENCOUNTER — Other Ambulatory Visit (HOSPITAL_BASED_OUTPATIENT_CLINIC_OR_DEPARTMENT_OTHER): Payer: Self-pay

## 2024-04-23 ENCOUNTER — Other Ambulatory Visit (HOSPITAL_BASED_OUTPATIENT_CLINIC_OR_DEPARTMENT_OTHER): Payer: Self-pay

## 2024-04-24 ENCOUNTER — Other Ambulatory Visit (HOSPITAL_BASED_OUTPATIENT_CLINIC_OR_DEPARTMENT_OTHER): Payer: Self-pay

## 2024-04-24 MED ORDER — METFORMIN HCL ER 500 MG PO TB24
500.0000 mg | ORAL_TABLET | Freq: Two times a day (BID) | ORAL | 1 refills | Status: AC
  Filled 2024-04-24: qty 180, 90d supply, fill #0

## 2024-04-27 ENCOUNTER — Encounter (HOSPITAL_BASED_OUTPATIENT_CLINIC_OR_DEPARTMENT_OTHER): Admitting: Radiology

## 2024-04-27 DIAGNOSIS — Z1231 Encounter for screening mammogram for malignant neoplasm of breast: Secondary | ICD-10-CM

## 2024-05-15 ENCOUNTER — Encounter (HOSPITAL_BASED_OUTPATIENT_CLINIC_OR_DEPARTMENT_OTHER): Admitting: Radiology
